# Patient Record
Sex: Female | Born: 1965 | Race: White | Hispanic: No | Marital: Married | Smoking: Never smoker
Health system: Southern US, Community
[De-identification: ages and names within clinical notes are randomized; demographics above are authoritative.]

## PROBLEM LIST (undated history)

## (undated) DIAGNOSIS — L237 Allergic contact dermatitis due to plants, except food: Secondary | ICD-10-CM

## (undated) DIAGNOSIS — S129XXA Fracture of neck, unspecified, initial encounter: Secondary | ICD-10-CM

## (undated) DIAGNOSIS — R51 Headache: Secondary | ICD-10-CM

## (undated) DIAGNOSIS — Z803 Family history of malignant neoplasm of breast: Secondary | ICD-10-CM

## (undated) DIAGNOSIS — R32 Unspecified urinary incontinence: Secondary | ICD-10-CM

## (undated) DIAGNOSIS — E039 Hypothyroidism, unspecified: Secondary | ICD-10-CM

## (undated) DIAGNOSIS — Z800 Family history of malignant neoplasm of digestive organs: Secondary | ICD-10-CM

## (undated) DIAGNOSIS — Z808 Family history of malignant neoplasm of other organs or systems: Secondary | ICD-10-CM

## (undated) DIAGNOSIS — K219 Gastro-esophageal reflux disease without esophagitis: Secondary | ICD-10-CM

## (undated) DIAGNOSIS — M2560 Stiffness of unspecified joint, not elsewhere classified: Secondary | ICD-10-CM

## (undated) DIAGNOSIS — R519 Headache, unspecified: Secondary | ICD-10-CM

## (undated) DIAGNOSIS — J309 Allergic rhinitis, unspecified: Secondary | ICD-10-CM

## (undated) DIAGNOSIS — M51369 Other intervertebral disc degeneration, lumbar region without mention of lumbar back pain or lower extremity pain: Secondary | ICD-10-CM

## (undated) DIAGNOSIS — M5136 Other intervertebral disc degeneration, lumbar region: Secondary | ICD-10-CM

## (undated) DIAGNOSIS — Z8042 Family history of malignant neoplasm of prostate: Secondary | ICD-10-CM

## (undated) HISTORY — DX: Family history of malignant neoplasm of digestive organs: Z80.0

## (undated) HISTORY — DX: Family history of malignant neoplasm of other organs or systems: Z80.8

## (undated) HISTORY — PX: BREAST CYST EXCISION: SHX579

## (undated) HISTORY — PX: WISDOM TOOTH EXTRACTION: SHX21

## (undated) HISTORY — DX: Family history of malignant neoplasm of prostate: Z80.42

## (undated) HISTORY — DX: Family history of malignant neoplasm of breast: Z80.3

---

## 1988-10-28 HISTORY — PX: AUGMENTATION MAMMAPLASTY: SUR837

## 2008-04-15 DIAGNOSIS — D219 Benign neoplasm of connective and other soft tissue, unspecified: Secondary | ICD-10-CM | POA: Insufficient documentation

## 2009-10-28 HISTORY — PX: CERVICAL BIOPSY  W/ LOOP ELECTRODE EXCISION: SUR135

## 2012-12-22 ENCOUNTER — Ambulatory Visit: Payer: Self-pay | Admitting: Family Medicine

## 2012-12-29 ENCOUNTER — Ambulatory Visit: Payer: Self-pay | Admitting: Family Medicine

## 2015-02-28 ENCOUNTER — Other Ambulatory Visit: Payer: Self-pay

## 2015-02-28 DIAGNOSIS — Z1231 Encounter for screening mammogram for malignant neoplasm of breast: Secondary | ICD-10-CM

## 2015-03-06 LAB — LIPID PANEL
CHOLESTEROL: 218 mg/dL — AB (ref 0–200)
HDL: 94 mg/dL — AB (ref 35–70)
LDL Cholesterol: 119 mg/dL
LDL/HDL RATIO: 1.5
TRIGLYCERIDES: 80 mg/dL (ref 40–160)

## 2015-03-06 LAB — HEPATIC FUNCTION PANEL
ALT: 12 U/L (ref 7–35)
AST: 18 U/L (ref 13–35)
Alkaline Phosphatase: 91 U/L (ref 25–125)
Bilirubin, Total: 0.3 mg/dL

## 2015-03-06 LAB — CBC AND DIFFERENTIAL
HEMATOCRIT: 45 % (ref 36–46)
HEMOGLOBIN: 15.5 g/dL (ref 12.0–16.0)
Neutrophils Absolute: 3 /uL
Platelets: 246 10*3/uL (ref 150–399)
WBC: 5.3 10^3/mL

## 2015-03-06 LAB — BASIC METABOLIC PANEL
BUN: 7 mg/dL (ref 4–21)
Creatinine: 0.8 mg/dL (ref 0.5–1.1)
Glucose: 90 mg/dL
Potassium: 4.3 mmol/L (ref 3.4–5.3)
SODIUM: 141 mmol/L (ref 137–147)

## 2015-03-06 LAB — TSH: TSH: 4.67 u[IU]/mL (ref 0.41–5.90)

## 2015-03-06 LAB — HEMOGLOBIN A1C: HEMOGLOBIN A1C: 5.3

## 2015-03-21 ENCOUNTER — Other Ambulatory Visit: Payer: Self-pay | Admitting: Family Medicine

## 2015-03-21 ENCOUNTER — Ambulatory Visit
Admission: RE | Admit: 2015-03-21 | Discharge: 2015-03-21 | Disposition: A | Discharge status: Home / Self Care | Payer: BC Managed Care – PPO | Source: Ambulatory Visit | Attending: Radiology | Admitting: Radiology

## 2015-03-21 DIAGNOSIS — Z9882 Breast implant status: Secondary | ICD-10-CM | POA: Diagnosis not present

## 2015-03-21 DIAGNOSIS — Z1231 Encounter for screening mammogram for malignant neoplasm of breast: Secondary | ICD-10-CM

## 2015-05-05 NOTE — Discharge Instructions (Signed)

## 2015-05-08 ENCOUNTER — Encounter
Admission: RE | Disposition: A | Discharge status: Home / Self Care | Payer: Self-pay | Source: Ambulatory Visit | Attending: Gastroenterology

## 2015-05-08 ENCOUNTER — Ambulatory Visit: Payer: BC Managed Care – PPO | Admitting: Anesthesiology

## 2015-05-08 ENCOUNTER — Ambulatory Visit
Admission: RE | Admit: 2015-05-08 | Discharge: 2015-05-08 | Disposition: A | Discharge status: Home / Self Care | Payer: BC Managed Care – PPO | Source: Ambulatory Visit | Attending: Gastroenterology | Admitting: Gastroenterology

## 2015-05-08 DIAGNOSIS — Z791 Long term (current) use of non-steroidal anti-inflammatories (NSAID): Secondary | ICD-10-CM | POA: Insufficient documentation

## 2015-05-08 DIAGNOSIS — Z793 Long term (current) use of hormonal contraceptives: Secondary | ICD-10-CM | POA: Insufficient documentation

## 2015-05-08 DIAGNOSIS — K640 First degree hemorrhoids: Secondary | ICD-10-CM | POA: Insufficient documentation

## 2015-05-08 DIAGNOSIS — K5730 Diverticulosis of large intestine without perforation or abscess without bleeding: Secondary | ICD-10-CM | POA: Insufficient documentation

## 2015-05-08 DIAGNOSIS — Z79899 Other long term (current) drug therapy: Secondary | ICD-10-CM | POA: Insufficient documentation

## 2015-05-08 DIAGNOSIS — Z1211 Encounter for screening for malignant neoplasm of colon: Secondary | ICD-10-CM | POA: Insufficient documentation

## 2015-05-08 DIAGNOSIS — R51 Headache: Secondary | ICD-10-CM | POA: Diagnosis not present

## 2015-05-08 DIAGNOSIS — M5136 Other intervertebral disc degeneration, lumbar region: Secondary | ICD-10-CM | POA: Insufficient documentation

## 2015-05-08 DIAGNOSIS — E039 Hypothyroidism, unspecified: Secondary | ICD-10-CM | POA: Insufficient documentation

## 2015-05-08 DIAGNOSIS — K219 Gastro-esophageal reflux disease without esophagitis: Secondary | ICD-10-CM | POA: Diagnosis not present

## 2015-05-08 HISTORY — DX: Fracture of neck, unspecified, initial encounter: S12.9XXA

## 2015-05-08 HISTORY — DX: Headache: R51

## 2015-05-08 HISTORY — DX: Headache, unspecified: R51.9

## 2015-05-08 HISTORY — DX: Unspecified urinary incontinence: R32

## 2015-05-08 HISTORY — DX: Allergic rhinitis, unspecified: J30.9

## 2015-05-08 HISTORY — PX: COLONOSCOPY: SHX5424

## 2015-05-08 HISTORY — DX: Other intervertebral disc degeneration, lumbar region: M51.36

## 2015-05-08 HISTORY — DX: Hypothyroidism, unspecified: E03.9

## 2015-05-08 HISTORY — DX: Stiffness of unspecified joint, not elsewhere classified: M25.60

## 2015-05-08 HISTORY — DX: Allergic contact dermatitis due to plants, except food: L23.7

## 2015-05-08 HISTORY — DX: Other intervertebral disc degeneration, lumbar region without mention of lumbar back pain or lower extremity pain: M51.369

## 2015-05-08 HISTORY — DX: Gastro-esophageal reflux disease without esophagitis: K21.9

## 2015-05-08 SURGERY — COLONOSCOPY
Anesthesia: Monitor Anesthesia Care | Wound class: Contaminated

## 2015-05-08 MED ORDER — STERILE WATER FOR IRRIGATION IR SOLN
Status: DC | PRN
  Administered 2015-05-08: 09:00:00

## 2015-05-08 MED ORDER — PROPOFOL 10 MG/ML IV BOLUS
INTRAVENOUS | Status: DC | PRN
  Administered 2015-05-08 (×10): 20 mg via INTRAVENOUS

## 2015-05-08 MED ORDER — LACTATED RINGERS IV SOLN
INTRAVENOUS | Status: DC
  Administered 2015-05-08: 09:00:00 via INTRAVENOUS

## 2015-05-08 MED ORDER — LIDOCAINE HCL (CARDIAC) 20 MG/ML IV SOLN
INTRAVENOUS | Status: DC | PRN
  Administered 2015-05-08: 30 mg via INTRAVENOUS

## 2015-05-08 MED ORDER — LACTATED RINGERS IV SOLN
INTRAVENOUS | Status: DC
  Administered 2015-05-08: 08:00:00 via INTRAVENOUS

## 2015-05-08 MED ORDER — ACETAMINOPHEN 325 MG PO TABS: 325.0000 mg | ORAL_TABLET | ORAL | Status: DC | PRN

## 2015-05-08 MED ORDER — ACETAMINOPHEN 160 MG/5ML PO SOLN: 325.0000 mg | ORAL | Status: DC | PRN

## 2015-05-08 SURGICAL SUPPLY — 28 items

## 2015-05-08 NOTE — Op Note (Signed)
[facility] Patient Name: Erika May Procedure Date: 05/08/2015 8:46 AM MRN: [id number] Account #: [id number] Date of Birth: 03/07/1966 Admit Type: Outpatient Age: 49 Room: [facility] OR ROOM 01 Gender: Female Note Status: Finalized Procedure:         Colonoscopy Indications:       Screening for colorectal malignant neoplasm Providers:         Lucilla Lame, MD Medicines:         Propofol per Anesthesia Complications:     No immediate complications. Procedure:         Pre-Anesthesia Assessment:                    - Prior to the procedure, a History and Physical was                     performed, and patient medications and allergies were                     reviewed. The patient's tolerance of previous anesthesia                     was also reviewed. The risks and benefits of the procedure                     and the sedation options and risks were discussed with the                     patient. All questions were answered, and informed consent                     was obtained. Prior Anticoagulants: The patient has taken                     no previous anticoagulant or antiplatelet agents. ASA                     Grade Assessment: II - A patient with mild systemic                     disease. After reviewing the risks and benefits, the                     patient was deemed in satisfactory condition to undergo                     the procedure.                    After obtaining informed consent, the colonoscope was                     passed under direct vision. Throughout the procedure, the                     patient's blood pressure, pulse, and oxygen saturations                     were monitored continuously. The Olympus CF H180AL                     colonoscope (S#: [id number]) was introduced through the anus                     and advanced to the the cecum, identified  by appendiceal                     orifice and ileocecal valve. The  colonoscopy was performed                     without difficulty. The patient tolerated the procedure                     well. The quality of the bowel preparation was excellent. Findings:      The perianal and digital rectal examinations were normal.      A single small-mouthed diverticulum was found in the sigmoid colon.      Non-bleeding internal hemorrhoids were found during retroflexion. The       hemorrhoids were Grade I (internal hemorrhoids that do not prolapse). Impression:        - Diverticulosis in the sigmoid colon.                    - Non-bleeding internal hemorrhoids.                    - No specimens collected. Recommendation:    - Repeat colonoscopy in 10 years for screening unless any                     change in family history or lower GI problems. Procedure Code(s): --- Professional ---                    [phone], Colonoscopy, flexible; diagnostic, including                     collection of specimen(s) by brushing or washing, when                     performed (separate procedure) Diagnosis Code(s): --- Professional ---                    Z12.11, Encounter for screening for malignant neoplasm of                     colon                    K64.0, First degree hemorrhoids CPT copyright 2014 American [employer] Association. All rights reserved. The codes documented in this report are preliminary and upon coder review may  be revised to meet current compliance requirements. Lucilla Lame, MD 05/08/2015 9:09:12 AM This report has been signed electronically. Number of Addenda: 0 Note Initiated On: 05/08/2015 8:46 AM Scope Withdrawal Time: 0 hours 7 minutes 56 seconds  Total Procedure Duration: 0 hours 13 minutes 48 seconds       [facility]

## 2015-05-08 NOTE — Transfer of Care (Signed)
Immediate Anesthesia Transfer of Care Note  Patient: Erika May  Procedure(s) Performed: Procedure(s): COLONOSCOPY (N/A)  Patient Location: PACU  Anesthesia Type: MAC  Level of Consciousness: awake, alert  and patient cooperative  Airway and Oxygen Therapy: Patient Spontanous Breathing and Patient connected to supplemental oxygen  Post-op Assessment: Post-op Vital signs reviewed, Patient's Cardiovascular Status Stable, Respiratory Function Stable, Patent Airway and No signs of Nausea or vomiting  Post-op Vital Signs: Reviewed and stable  Complications: No apparent anesthesia complications

## 2015-05-08 NOTE — H&P (Signed)
[facility]  [address] [place], [place] [address] Phone: [phone] Fax : [phone]  Primary Care Physician:  No primary care provider on file. Primary Gastroenterologist:  Dr. Allen Norris  Pre-Procedure History & Physical: HPI:  Erika May Hy is a 49 y.o. female is here for a screening colonoscopy.   Past Medical History  Diagnosis Date  . 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 Hypothyroidism   . Rhinitis, allergic   . DDD (degenerative disc disease), lumbar   . Incontinence   . Joint stiffness   . GERD (gastroesophageal reflux disease)     heartburn occasional  . 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 Headache     1/week    Past Surgical History  Procedure Laterality Date  . Cervical biopsy  w/ loop electrode excision  10/2009  . Wisdom tooth extraction    . Augmentation mammaplasty Bilateral 1990  . Breast cyst excision Bilateral 1990's    benign    Prior to Admission medications   Medication Sig Start Date End Date Taking? Authorizing Provider  Calcium Carb-Cholecalciferol (CALCIUM + D3) 600-200 MG-UNIT TABS Take by mouth 3 (three) times a week.    Yes Historical Provider, MD  ibuprofen (ADVIL,MOTRIN) 200 MG tablet Take 200 mg by mouth every 6 (six) hours as needed for headache.    Yes Historical Provider, MD  levothyroxine (SYNTHROID, LEVOTHROID) 25 MCG tablet Take 50 mcg by mouth daily before breakfast.    Yes Historical Provider, MD  loratadine (CLARITIN) 10 MG tablet Take 10 mg by mouth daily. am   Yes Historical Provider, MD  Multiple Vitamin (MULTIVITAMIN) capsule Take 1 capsule by mouth daily. am   Yes Historical Provider, MD  Norethindrone Acetate-Ethinyl Estradiol (LOESTRIN 1.5/30, 21,) 1.5-30 MG-MCG tablet Take 1 tablet by mouth daily.   Yes Historical Provider, MD  Sennosides-Docusate Sodium (PERI-COLACE PO) Take by mouth as needed.   Yes Historical Provider, MD  calcium carbonate (TUMS - DOSED IN MG  ELEMENTAL CALCIUM) 500 MG chewable tablet Chew 1 tablet by mouth as needed for indigestion or heartburn.    Historical Provider, MD    Allergies as of 04/10/2015  . (Not on File)    History reviewed. No pertinent family history.  History   Social History  . Marital Status: Married    Spouse Name: N/A  . Number of Children: N/A  . Years of Education: N/A   Occupational History  . Not on file.   Social History Main Topics  . Smoking status: Never Smoker   . Smokeless tobacco: Not on file  . Alcohol Use: Yes     Comment: occasional/1 drink 2x week  . Drug Use: Not on file  . Sexual Activity: Yes    Birth Control/ Protection: Pill   Other Topics Concern  . Not on file   Social History Narrative    Review of Systems: See HPI, otherwise negative ROS  Physical Exam: BP 138/94 mmHg  Pulse 60  Temp(Src) 97.7 F (36.5 C)  Resp 16  Ht 5\' 4"  (1.626 m)  Wt 150 lb (68.04 kg)  BMI 25.73 kg/m2  SpO2 100%  LMP 02/28/2015 (Approximate) General:   Alert,  pleasant and cooperative in NAD Head:  Normocephalic and atraumatic. Neck:  Supple; no masses or thyromegaly. Lungs:  Clear throughout to auscultation.    Heart:  Regular rate and rhythm. Abdomen:  Soft, nontender and nondistended. Normal bowel sounds, without  guarding, and without rebound.   Neurologic:  Alert and  oriented x4;  grossly normal neurologically.  Impression/Plan: Erika May is now here to undergo a screening colonoscopy.  Risks, benefits, and alternatives regarding colonoscopy have been reviewed with the patient.  Questions have been answered.  All parties agreeable.

## 2015-05-08 NOTE — Anesthesia Postprocedure Evaluation (Signed)
  Anesthesia Post-op Note  Patient: Erika May  Procedure(s) Performed: Procedure(s): COLONOSCOPY (N/A)  Anesthesia type:MAC  Patient location: PACU  Post pain: Pain level controlled  Post assessment: Post-op Vital signs reviewed, Patient's Cardiovascular Status Stable, Respiratory Function Stable, Patent Airway and No signs of Nausea or vomiting  Post vital signs: Reviewed and stable  Last Vitals:  Filed Vitals:   05/08/15 0915  BP: 97/49  Pulse: 69  Temp:   Resp: 17    Level of consciousness: awake, alert  and patient cooperative  Complications: No apparent anesthesia complications

## 2015-05-08 NOTE — Anesthesia Preprocedure Evaluation (Signed)
Anesthesia Evaluation  Patient identified by MRN, date of birth, ID band  Reviewed: Allergy & Precautions, H&P , NPO status , Patient's Chart, lab work & pertinent test results  Airway Mallampati: I  TM Distance: >3 FB Neck ROM: full    Dental no notable dental hx.    Pulmonary    Pulmonary exam normal       Cardiovascular Rhythm:regular Rate:Normal     Neuro/Psych    GI/Hepatic GERD-  ,  Endo/Other  Hypothyroidism   Renal/GU      Musculoskeletal   Abdominal   Peds  Hematology   Anesthesia Other Findings   Reproductive/Obstetrics                             Anesthesia Physical Anesthesia Plan  ASA: II  Anesthesia Plan: MAC   Post-op Pain Management:    Induction:   Airway Management Planned:   Additional Equipment:   Intra-op Plan:   Post-operative Plan:   Informed Consent: I have reviewed the patients History and Physical, chart, labs and discussed the procedure including the risks, benefits and alternatives for the proposed anesthesia with the patient or authorized representative who has indicated his/her understanding and acceptance.     Plan Discussed with: CRNA  Anesthesia Plan Comments:         Anesthesia Quick Evaluation

## 2015-05-09 ENCOUNTER — Encounter: Payer: Self-pay | Admitting: Gastroenterology

## 2015-10-07 ENCOUNTER — Other Ambulatory Visit: Payer: Self-pay | Admitting: Family Medicine

## 2015-10-07 DIAGNOSIS — E039 Hypothyroidism, unspecified: Secondary | ICD-10-CM

## 2015-10-09 DIAGNOSIS — E039 Hypothyroidism, unspecified: Secondary | ICD-10-CM | POA: Insufficient documentation

## 2015-10-09 NOTE — Telephone Encounter (Signed)
Last OV 02/2015  Thanks,   -Advay Volante  

## 2015-10-10 ENCOUNTER — Telehealth: Payer: Self-pay | Admitting: Family Medicine

## 2016-01-30 ENCOUNTER — Telehealth: Payer: Self-pay | Admitting: Family Medicine

## 2016-03-13 DIAGNOSIS — IMO0001 Reserved for inherently not codable concepts without codable children: Secondary | ICD-10-CM | POA: Insufficient documentation

## 2016-03-13 DIAGNOSIS — N951 Menopausal and female climacteric states: Secondary | ICD-10-CM | POA: Insufficient documentation

## 2016-03-13 DIAGNOSIS — Z3009 Encounter for other general counseling and advice on contraception: Secondary | ICD-10-CM | POA: Insufficient documentation

## 2016-03-13 DIAGNOSIS — E559 Vitamin D deficiency, unspecified: Secondary | ICD-10-CM | POA: Insufficient documentation

## 2016-03-13 DIAGNOSIS — L255 Unspecified contact dermatitis due to plants, except food: Secondary | ICD-10-CM | POA: Insufficient documentation

## 2016-03-13 DIAGNOSIS — R030 Elevated blood-pressure reading, without diagnosis of hypertension: Secondary | ICD-10-CM

## 2016-03-13 DIAGNOSIS — R7303 Prediabetes: Secondary | ICD-10-CM | POA: Insufficient documentation

## 2016-03-13 DIAGNOSIS — D239 Other benign neoplasm of skin, unspecified: Secondary | ICD-10-CM | POA: Insufficient documentation

## 2016-03-13 DIAGNOSIS — E785 Hyperlipidemia, unspecified: Secondary | ICD-10-CM | POA: Insufficient documentation

## 2016-04-02 ENCOUNTER — Encounter: Payer: Self-pay | Admitting: Family Medicine

## 2016-04-02 ENCOUNTER — Ambulatory Visit (INDEPENDENT_AMBULATORY_CARE_PROVIDER_SITE_OTHER): Payer: BC Managed Care – PPO | Admitting: Family Medicine

## 2016-04-02 VITALS — BP 130/78 | HR 76 | Temp 97.9°F | Resp 16 | Ht 64.5 in | Wt 149.0 lb

## 2016-04-02 DIAGNOSIS — Z0000 Encounter for general adult medical examination without abnormal findings: Secondary | ICD-10-CM

## 2016-04-02 DIAGNOSIS — R030 Elevated blood-pressure reading, without diagnosis of hypertension: Secondary | ICD-10-CM | POA: Diagnosis not present

## 2016-04-02 DIAGNOSIS — Z23 Encounter for immunization: Secondary | ICD-10-CM | POA: Diagnosis not present

## 2016-04-02 DIAGNOSIS — Z1239 Encounter for other screening for malignant neoplasm of breast: Secondary | ICD-10-CM | POA: Diagnosis not present

## 2016-04-02 DIAGNOSIS — R7303 Prediabetes: Secondary | ICD-10-CM | POA: Diagnosis not present

## 2016-04-02 DIAGNOSIS — E785 Hyperlipidemia, unspecified: Secondary | ICD-10-CM

## 2016-04-02 DIAGNOSIS — IMO0001 Reserved for inherently not codable concepts without codable children: Secondary | ICD-10-CM

## 2016-04-02 DIAGNOSIS — E559 Vitamin D deficiency, unspecified: Secondary | ICD-10-CM | POA: Diagnosis not present

## 2016-04-02 DIAGNOSIS — E039 Hypothyroidism, unspecified: Secondary | ICD-10-CM | POA: Diagnosis not present

## 2016-04-02 LAB — POCT URINALYSIS DIPSTICK
Bilirubin, UA: NEGATIVE
Blood, UA: NEGATIVE
Glucose, UA: NEGATIVE
Ketones, UA: NEGATIVE
Leukocytes, UA: NEGATIVE
Nitrite, UA: NEGATIVE
PH UA: 7
PROTEIN UA: NEGATIVE
UROBILINOGEN UA: 0.2

## 2016-04-02 MED ORDER — MICROGESTIN FE 1.5/30 1.5-30 MG-MCG PO TABS: 1.0000 | ORAL_TABLET | Freq: Every day | ORAL | Status: DC

## 2016-04-02 MED ORDER — LEVOTHYROXINE SODIUM 50 MCG PO TABS: 50.0000 ug | ORAL_TABLET | Freq: Every day | ORAL | Status: DC

## 2016-04-02 NOTE — Patient Instructions (Addendum)
Please call the [facility] at [facility] to schedule this at [phone]   

## 2016-04-02 NOTE — Progress Notes (Signed)
Patient ID: Erika May, female   DOB: 01-13-1966, 50 y.o.   MRN: [id number]       Patient: Erika May, Female    DOB: [birth year], 50 y.o.   MRN: [id number] Visit Date: 04/02/2016  Today's Provider: Margarita Rana, MD   Chief Complaint  Patient presents with  . Annual Exam   Subjective:    Annual physical exam Erika May is a 50 y.o. female who presents today for health maintenance and complete physical. She feels well. She reports exercising daily. She reports she is sleeping well.  02/28/15 CPE 01/19/14 Pap-neg; HPV-neg 03/21/15 Mammogram-BI-RADS 1 05/08/15 Colonoscopy-diverticulosis, recheck in 10 yrs. Dr. Allen Norris  -----------------------------------------------------------------   Review of Systems  Constitutional: Positive for diaphoresis and fatigue.  HENT: Positive for postnasal drip.   Eyes: Negative.   Respiratory: Negative.   Cardiovascular: Negative.   Gastrointestinal: Negative.   Endocrine: Negative.   Genitourinary: Positive for urgency.  Musculoskeletal: Negative.   Skin: Negative.   Allergic/Immunologic: Positive for environmental allergies.  Neurological: Negative.   Hematological: Negative.   Psychiatric/Behavioral: Negative.     Social History      She  reports that she has never smoked. She has never used smokeless tobacco. She reports that she drinks alcohol. She reports that she does not use illicit drugs.       Social History   Social History  . Marital Status: Married    Spouse Name: N/A  . Number of Children: N/A  . Years of Education: N/A   Social History Main Topics  . Smoking status: Never Smoker   . Smokeless tobacco: Never Used  . Alcohol Use: Yes     Comment: occasional/1 drink 2x week  . Drug Use: No  . Sexual Activity: Yes    Birth Control/ Protection: Pill   Other Topics Concern  . None   Social History Narrative    Past Medical History  Diagnosis Date  . Allergic dermatitis due to  poison ivy   . Hypothyroidism   . Rhinitis, allergic   . DDD (degenerative disc disease), lumbar   . Incontinence   . Joint stiffness   . GERD (gastroesophageal reflux disease)     heartburn occasional  . 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 Headache     1/week     Patient Active Problem List   Diagnosis Date Noted  . HLD (hyperlipidemia) 03/13/2016  . Family planning 03/13/2016  . Benign neoplasm of skin 03/13/2016  . Blood pressure elevated 03/13/2016  . Symptomatic menopausal or female climacteric states 03/13/2016  . Borderline diabetes 03/13/2016  . Contact dermatitis due to Genus Toxicodendron 03/13/2016  . Avitaminosis D 03/13/2016  . Adult hypothyroidism 10/09/2015  . Special screening for malignant neoplasms, colon   . First degree hemorrhoids   . Fibroid 04/15/2008  . Allergic rhinitis 08/10/2007  . Narrowing of intervertebral disc space 08/10/2007    Past Surgical History  Procedure Laterality Date  . Cervical biopsy  w/ loop electrode excision  10/2009  . Wisdom tooth extraction    . Augmentation mammaplasty Bilateral 1990  . Breast cyst excision Bilateral 1990's    benign  . Colonoscopy N/A 05/08/2015    Procedure: COLONOSCOPY;  Surgeon: Lucilla Lame, MD;  Location: [facility];  Service: Gastroenterology;  Laterality: N/A;    Family History        Family Status  Relation Status Death Age  . Mother Alive   .  Father Deceased 27  . Sister Alive   . Brother Alive         Her family history includes Arthritis in her mother; Cataracts in her father; Emphysema in her father; Hyperlipidemia in her father; Hypothyroidism in her mother; Melanoma in her father.    No Known Allergies  Previous Medications   CALCIUM CARB-CHOLECALCIFEROL (CALCIUM + D3) 600-200 MG-UNIT TABS    Take by mouth 3 (three) times a week.    CALCIUM CARBONATE (TUMS - DOSED IN MG ELEMENTAL CALCIUM) 500 MG CHEWABLE TABLET    Chew 1 tablet by mouth as  needed for indigestion or heartburn.   IBUPROFEN (ADVIL,MOTRIN) 200 MG TABLET    Take 200 mg by mouth every 6 (six) hours as needed for headache.    LORATADINE (CLARITIN) 10 MG TABLET    Take 10 mg by mouth daily. am   MICROGESTIN FE 1.5/30 1.5-30 MG-MCG TABLET    Take 1 tablet by mouth daily.   MULTIPLE VITAMIN (MULTIVITAMIN) CAPSULE    Take 1 capsule by mouth daily. am   SENNOSIDES-DOCUSATE SODIUM (PERI-COLACE PO)    Take by mouth as needed.    Patient Care Team: Margarita Rana, MD as PCP - General (Family Medicine)     Objective:   Vitals: BP 130/78 mmHg  Pulse 76  Temp(Src) 97.9 F (36.6 C) (Oral)  Resp 16  Ht 5' 4.5" (1.638 m)  Wt 149 lb (67.586 kg)  BMI 25.19 kg/m2  LMP 03/24/2016 (Exact Date)   Physical Exam  Constitutional: She is oriented to person, place, and time. She appears well-developed and well-nourished.  HENT:  Head: Normocephalic and atraumatic.  Right Ear: Tympanic membrane, external ear and ear canal normal.  Left Ear: Tympanic membrane, external ear and ear canal normal.  Nose: Nose normal.  Mouth/Throat: Uvula is midline, oropharynx is clear and moist and mucous membranes are normal.  Eyes: Conjunctivae, EOM and lids are normal. Pupils are equal, round, and reactive to light.  Neck: Trachea normal and normal range of motion. Neck supple. Carotid bruit is not present. No thyroid mass and no thyromegaly present.  Cardiovascular: Normal rate, regular rhythm and normal heart sounds.   Pulmonary/Chest: Effort normal and breath sounds normal.  Bilateral breast implants  Abdominal: Soft. Normal appearance and bowel sounds are normal. There is no hepatosplenomegaly. There is no tenderness.  Genitourinary: No breast swelling, tenderness or discharge.  Musculoskeletal: Normal range of motion.  Lymphadenopathy:    She has no cervical adenopathy.    She has no axillary adenopathy.  Neurological: She is alert and oriented to person, place, and time. She has normal  strength. No cranial nerve deficit.  Skin: Skin is warm, dry and intact.  Psychiatric: She has a normal mood and affect. Her speech is normal and behavior is normal. Judgment and thought content normal. Cognition and memory are normal.    Depression Screen PHQ 2/9 Scores 04/02/2016  PHQ - 2 Score 0    Assessment & Plan:     Routine Health Maintenance and Physical Exam  Exercise Activities and Dietary recommendations Goals    None      Immunization History  Administered Date(s) Administered  . Tdap 04/02/2016      1. Annual physical exam Stable. Patient advised to continue eating healthy and exercise daily. - POCT urinalysis dipstick  2. Hypothyroidism, unspecified hypothyroidism type - TSH  3. Borderline diabetes - Hemoglobin A1c  4. Avitaminosis D - VITAMIN D 25 Hydroxy (Vit-D Deficiency, Fractures)  5. HLD (hyperlipidemia) - Lipid Panel With LDL/HDL Ratio  6. Blood pressure elevated - CBC with Differential/Platelet - Comprehensive metabolic panel  7. Need for Tdap vaccination - Tdap vaccine greater than or equal to 7yo IM  8. Breast cancer screening - MM DIGITAL SCREENING BILATERAL; Future   Patient seen and examined by Dr. Jerrell Belfast, and note scribed by Philbert Riser. Dimas, CMA.  I have reviewed the document for accuracy and completeness and I agree with above. Jerrell Belfast, MD   Margarita Rana, MD    --------------------------------------------------------------------

## 2016-04-16 ENCOUNTER — Telehealth: Payer: Self-pay

## 2016-04-16 LAB — COMPREHENSIVE METABOLIC PANEL
ALBUMIN: 4.4 g/dL (ref 3.5–5.5)
ALT: 8 IU/L (ref 0–32)
AST: 16 IU/L (ref 0–40)
Albumin/Globulin Ratio: 2.2 (ref 1.2–2.2)
Alkaline Phosphatase: 78 IU/L (ref 39–117)
BILIRUBIN TOTAL: 0.3 mg/dL (ref 0.0–1.2)
BUN / CREAT RATIO: 11 (ref 9–23)
BUN: 9 mg/dL (ref 6–24)
CHLORIDE: 101 mmol/L (ref 96–106)
CO2: 22 mmol/L (ref 18–29)
Calcium: 9.5 mg/dL (ref 8.7–10.2)
Creatinine, Ser: 0.82 mg/dL (ref 0.57–1.00)
GFR calc non Af Amer: 83 mL/min/{1.73_m2} (ref >59)
GFR, EST AFRICAN AMERICAN: 96 mL/min/{1.73_m2} (ref >59)
Globulin, Total: 2 g/dL (ref 1.5–4.5)
Glucose: 91 mg/dL (ref 65–99)
POTASSIUM: 4.5 mmol/L (ref 3.5–5.2)
Sodium: 140 mmol/L (ref 134–144)
TOTAL PROTEIN: 6.4 g/dL (ref 6.0–8.5)

## 2016-04-16 LAB — LIPID PANEL WITH LDL/HDL RATIO
Cholesterol, Total: 177 mg/dL (ref 100–199)
HDL: 72 mg/dL (ref >39)
LDL Calculated: 92 mg/dL (ref 0–99)
LDl/HDL Ratio: 1.3 ratio units (ref 0.0–3.2)
Triglycerides: 67 mg/dL (ref 0–149)
VLDL CHOLESTEROL CAL: 13 mg/dL (ref 5–40)

## 2016-04-16 LAB — CBC WITH DIFFERENTIAL/PLATELET
BASOS: 1 %
Basophils Absolute: 0 10*3/uL (ref 0.0–0.2)
EOS (ABSOLUTE): 0.2 10*3/uL (ref 0.0–0.4)
Eos: 3 %
Hematocrit: 40.9 % (ref 34.0–46.6)
Hemoglobin: 14 g/dL (ref 11.1–15.9)
IMMATURE GRANS (ABS): 0 10*3/uL (ref 0.0–0.1)
Immature Granulocytes: 0 %
LYMPHS ABS: 1.9 10*3/uL (ref 0.7–3.1)
LYMPHS: 34 %
MCH: 31.6 pg (ref 26.6–33.0)
MCHC: 34.2 g/dL (ref 31.5–35.7)
MCV: 92 fL (ref 79–97)
Monocytes Absolute: 0.4 10*3/uL (ref 0.1–0.9)
Monocytes: 6 %
Neutrophils Absolute: 3.1 10*3/uL (ref 1.4–7.0)
Neutrophils: 56 %
PLATELETS: 240 10*3/uL (ref 150–379)
RBC: 4.43 x10E6/uL (ref 3.77–5.28)
RDW: 12.6 % (ref 12.3–15.4)
WBC: 5.6 10*3/uL (ref 3.4–10.8)

## 2016-04-16 LAB — HEMOGLOBIN A1C
Est. average glucose Bld gHb Est-mCnc: 103 mg/dL
Hgb A1c MFr Bld: 5.2 % (ref 4.8–5.6)

## 2016-04-16 LAB — TSH: TSH: 2.8 u[IU]/mL (ref 0.450–4.500)

## 2016-04-16 LAB — VITAMIN D 25 HYDROXY (VIT D DEFICIENCY, FRACTURES): Vit D, 25-Hydroxy: 44.2 ng/mL (ref 30.0–100.0)

## 2016-04-16 NOTE — Telephone Encounter (Signed)
Pt advised.   Thanks,   -Brindley Madarang  

## 2016-04-16 NOTE — Telephone Encounter (Signed)
-----   Message from Margarita Rana, MD sent at 04/16/2016  6:53 AM EDT ----- Labs normal. Please notify patient. Thanks.

## 2016-06-04 NOTE — Telephone Encounter (Signed)
error 

## 2016-06-27 ENCOUNTER — Telehealth: Payer: Self-pay | Admitting: Physician Assistant

## 2016-06-27 DIAGNOSIS — Z1239 Encounter for other screening for malignant neoplasm of breast: Secondary | ICD-10-CM

## 2016-06-27 NOTE — Telephone Encounter (Signed)
Pt states she had her CPE June 2017 with Dr Venia Minks and is trying to schedule a mammogram.  [name] is requesting an order sent.  CB#[phone]/MW

## 2016-06-28 NOTE — Telephone Encounter (Signed)
Mammogram ordered

## 2016-06-28 NOTE — Telephone Encounter (Signed)
Patient advised.  Thanks,  -Joseline 

## 2016-07-29 ENCOUNTER — Ambulatory Visit
Admission: RE | Admit: 2016-07-29 | Discharge: 2016-07-29 | Disposition: A | Discharge status: Home / Self Care | Payer: BC Managed Care – PPO | Source: Ambulatory Visit | Attending: Physician Assistant | Admitting: Physician Assistant

## 2016-07-29 ENCOUNTER — Other Ambulatory Visit: Payer: Self-pay | Admitting: Physician Assistant

## 2016-07-29 DIAGNOSIS — Z9882 Breast implant status: Secondary | ICD-10-CM | POA: Diagnosis not present

## 2016-07-29 DIAGNOSIS — Z1231 Encounter for screening mammogram for malignant neoplasm of breast: Secondary | ICD-10-CM | POA: Insufficient documentation

## 2016-07-29 DIAGNOSIS — Z1239 Encounter for other screening for malignant neoplasm of breast: Secondary | ICD-10-CM

## 2017-04-03 ENCOUNTER — Ambulatory Visit (INDEPENDENT_AMBULATORY_CARE_PROVIDER_SITE_OTHER): Payer: BC Managed Care – PPO | Admitting: Physician Assistant

## 2017-04-03 ENCOUNTER — Encounter: Payer: Self-pay | Admitting: Physician Assistant

## 2017-04-03 VITALS — BP 138/80 | HR 88 | Temp 98.2°F | Resp 16 | Ht 64.5 in | Wt 163.0 lb

## 2017-04-03 DIAGNOSIS — Z1239 Encounter for other screening for malignant neoplasm of breast: Secondary | ICD-10-CM

## 2017-04-03 DIAGNOSIS — Z6827 Body mass index (BMI) 27.0-27.9, adult: Secondary | ICD-10-CM

## 2017-04-03 DIAGNOSIS — N951 Menopausal and female climacteric states: Secondary | ICD-10-CM | POA: Diagnosis not present

## 2017-04-03 DIAGNOSIS — Z1231 Encounter for screening mammogram for malignant neoplasm of breast: Secondary | ICD-10-CM

## 2017-04-03 DIAGNOSIS — R7303 Prediabetes: Secondary | ICD-10-CM | POA: Diagnosis not present

## 2017-04-03 DIAGNOSIS — Z6826 Body mass index (BMI) 26.0-26.9, adult: Secondary | ICD-10-CM | POA: Insufficient documentation

## 2017-04-03 DIAGNOSIS — E039 Hypothyroidism, unspecified: Secondary | ICD-10-CM

## 2017-04-03 DIAGNOSIS — E559 Vitamin D deficiency, unspecified: Secondary | ICD-10-CM | POA: Diagnosis not present

## 2017-04-03 DIAGNOSIS — Z0000 Encounter for general adult medical examination without abnormal findings: Secondary | ICD-10-CM

## 2017-04-03 DIAGNOSIS — E7800 Pure hypercholesterolemia, unspecified: Secondary | ICD-10-CM

## 2017-04-03 MED ORDER — MICROGESTIN FE 1.5/30 1.5-30 MG-MCG PO TABS: 1.0000 | ORAL_TABLET | Freq: Every day | ORAL | 3 refills | Status: DC

## 2017-04-03 MED ORDER — LEVOTHYROXINE SODIUM 50 MCG PO TABS: 50.0000 ug | ORAL_TABLET | Freq: Every day | ORAL | 3 refills | Status: DC

## 2017-04-03 MED ORDER — PHENTERMINE HCL 37.5 MG PO TABS: 37.5000 mg | ORAL_TABLET | Freq: Every day | ORAL | 0 refills | Status: DC

## 2017-04-03 NOTE — Progress Notes (Signed)
Patient: Erika May, Female    DOB: 1966/01/10, 51 y.o.   MRN: [id number] Visit Date: 04/03/2017  Today's Provider: Mar Daring, PA-C   Chief Complaint  Patient presents with  . Annual Exam   Subjective:    Annual physical exam Erika May is a 51 y.o. female who presents today for health maintenance and complete physical. She feels fairly well. She is c/o fatigue. She attributes this to weight gain. She reports exercising 3-5 times a week; walks for 50 minutes. Shereports she is sleeping well.  Wt Readings from Last 3 Encounters:  04/03/17 163 lb (73.9 kg)  04/02/16 149 lb (67.6 kg)  02/28/15 154 lb (69.9 kg)   Pt is perimenopausal. Her menses are irregular, and are becoming lighter. She is experiencing weight gain, hot flashes (while not taking OCPs). She needs a refill on her OCPs.  Last CPE- 04/02/2016 Last pap- 01/19/2014- Negative. HPV negative. Last mammogram- 07/29/2016- BI-RADS 1 Last colonoscopy- 05/08/2015. Diverticulosis. Recheck 10 years. -----------------------------------------------------------------   Review of Systems  Constitutional: Positive for activity change, appetite change, diaphoresis and unexpected weight change. Negative for chills, fatigue and fever.  HENT: Positive for congestion. Negative for dental problem, drooling, ear discharge, ear pain, facial swelling, hearing loss, mouth sores, nosebleeds, postnasal drip, rhinorrhea, sinus pain, sinus pressure, sneezing, sore throat, tinnitus, trouble swallowing and voice change.   Eyes: Negative.   Respiratory: Positive for cough. Negative for apnea, choking, chest tightness, shortness of breath, wheezing and stridor.   Cardiovascular: Negative.   Gastrointestinal: Negative.   Endocrine: Positive for cold intolerance and heat intolerance. Negative for polydipsia, polyphagia and polyuria.  Genitourinary: Positive for enuresis. Negative for decreased urine volume,  difficulty urinating, dyspareunia, dysuria, flank pain, frequency, genital sores, hematuria, menstrual problem, pelvic pain, urgency, vaginal bleeding, vaginal discharge and vaginal pain.  Musculoskeletal: Positive for arthralgias, back pain, neck pain and neck stiffness. Negative for gait problem, joint swelling and myalgias.  Skin: Positive for rash (occ poison ivey). Negative for color change, pallor and wound.  Allergic/Immunologic: Positive for environmental allergies. Negative for food allergies and immunocompromised state.  Neurological: Negative.   Hematological: Negative.   Psychiatric/Behavioral: Negative.     Social History      She  reports that she has never smoked. She has never used smokeless tobacco. She reports that she drinks alcohol. She reports that she does not use drugs.       Social History   Social History  . Marital status: Married    Spouse name: N/A  . Number of children: N/A  . Years of education: N/A   Social History Main Topics  . Smoking status: Never Smoker  . Smokeless tobacco: Never Used  . Alcohol use Yes     Comment: occasional/1 drink 2x week  . Drug use: No  . Sexual activity: Yes    Birth control/ protection: Pill   Other Topics Concern  . None   Social History Narrative  . None    Past Medical History:  Diagnosis Date  . 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 DDD (degenerative disc disease), lumbar   . GERD (gastroesophageal reflux disease)    heartburn occasional  . Headache    1/week  . Hypothyroidism   . Incontinence   . Joint stiffness   . Rhinitis, allergic      Patient Active Problem  List   Diagnosis Date Noted  . HLD (hyperlipidemia) 03/13/2016  . Family planning 03/13/2016  . Benign neoplasm of skin 03/13/2016  . Blood pressure elevated 03/13/2016  . Symptomatic menopausal or female climacteric states 03/13/2016  . Borderline diabetes  03/13/2016  . Contact dermatitis due to Genus Toxicodendron 03/13/2016  . Avitaminosis D 03/13/2016  . Adult hypothyroidism 10/09/2015  . Special screening for malignant neoplasms, colon   . First degree hemorrhoids   . Fibroid 04/15/2008  . Allergic rhinitis 08/10/2007  . Narrowing of intervertebral disc space 08/10/2007    Past Surgical History:  Procedure Laterality Date  . AUGMENTATION MAMMAPLASTY Bilateral 4765   silicone  . BREAST CYST EXCISION Bilateral 1990's   benign  . CERVICAL BIOPSY  W/ LOOP ELECTRODE EXCISION  10/2009  . COLONOSCOPY N/A 05/08/2015   Procedure: COLONOSCOPY;  Surgeon: Lucilla Lame, MD;  Location: [place];  Service: Gastroenterology;  Laterality: N/A;  . WISDOM TOOTH EXTRACTION      Family History        Family Status  Relation Status  . Mother Alive  . Father Deceased at age 38  . Sister Alive  . Mat Aunt (Not Specified)        Her family history includes Arthritis in her mother; Breast cancer in her maternal aunt; Cataracts in her father; Emphysema in her father; Healthy in her sister; Hyperlipidemia in her father and mother; Hypertension in her mother; Hypothyroidism in her mother; Melanoma in her father; Transient ischemic attack in her mother.     No Known Allergies   Current Outpatient Prescriptions:  .  Calcium Carb-Cholecalciferol (CALCIUM + D3) 600-200 MG-UNIT TABS, Take by mouth 3 (three) times a week. , Disp: , Rfl:  .  calcium carbonate (TUMS - DOSED IN MG ELEMENTAL CALCIUM) 500 MG chewable tablet, Chew 1 tablet by mouth as needed for indigestion or heartburn., Disp: , Rfl:  .  ibuprofen (ADVIL,MOTRIN) 200 MG tablet, Take 200 mg by mouth every 6 (six) hours as needed for headache. , Disp: , Rfl:  .  levothyroxine (LEVOTHROID) 50 MCG tablet, Take 1 tablet (50 mcg total) by mouth daily before breakfast., Disp: 90 tablet, Rfl: 3 .  loratadine (CLARITIN) 10 MG tablet, Take 10 mg by mouth daily. am, Disp: , Rfl:  .  MICROGESTIN FE  1.5/30 1.5-30 MG-MCG tablet, Take 1 tablet by mouth daily., Disp: 3 Package, Rfl: 3 .  Multiple Vitamin (MULTIVITAMIN) capsule, Take 1 capsule by mouth daily. am, Disp: , Rfl:  .  Sennosides-Docusate Sodium (PERI-COLACE PO), Take by mouth as needed., Disp: , Rfl:    Patient Care Team: Mar Daring, PA-C as PCP - General (Family Medicine)      Objective:   Vitals: BP 138/80 (BP Location: Right Arm, Patient Position: Sitting, Cuff Size: Normal)   Pulse 88   Temp 98.2 F (36.8 C) (Oral)   Resp 16   Ht 5' 4.5" (1.638 m)   Wt 163 lb (73.9 kg)   LMP 03/24/2017   BMI 27.55 kg/m    Vitals:   04/03/17 1408  BP: 138/80  Pulse: 88  Resp: 16  Temp: 98.2 F (36.8 C)  TempSrc: Oral  Weight: 163 lb (73.9 kg)  Height: 5' 4.5" (1.638 m)     Physical Exam  Constitutional: She is oriented to person, place, and time. She appears well-developed and well-nourished. No distress.  HENT:  Head: Normocephalic and atraumatic.  Right Ear: Hearing, tympanic membrane, external ear and ear canal  normal.  Left Ear: Hearing, tympanic membrane, external ear and ear canal normal.  Nose: Nose normal.  Mouth/Throat: Uvula is midline, oropharynx is clear and moist and mucous membranes are normal. No oropharyngeal exudate.  Eyes: Conjunctivae and EOM are normal. Pupils are equal, round, and reactive to light. Right eye exhibits no discharge. Left eye exhibits no discharge. No scleral icterus.  Neck: Normal range of motion. Neck supple. No JVD present. Carotid bruit is not present. No tracheal deviation present. No thyromegaly present.  Cardiovascular: Normal rate, regular rhythm, normal heart sounds and intact distal pulses.  Exam reveals no gallop and no friction rub.   No murmur heard. Pulmonary/Chest: Effort normal and breath sounds normal. No respiratory distress. She has no wheezes. She has no rales. She exhibits no tenderness. Right breast exhibits no inverted nipple, no mass, no nipple  discharge, no skin change and no tenderness. Left breast exhibits no inverted nipple, no mass, no nipple discharge, no skin change and no tenderness. Breasts are symmetrical.  Abdominal: Soft. Bowel sounds are normal. She exhibits no distension and no mass. There is no tenderness. There is no rebound and no guarding.  Musculoskeletal: Normal range of motion. She exhibits no edema or tenderness.  Lymphadenopathy:    She has no cervical adenopathy.  Neurological: She is alert and oriented to person, place, and time.  Skin: Skin is warm and dry. No rash noted. She is not diaphoretic.  Psychiatric: She has a normal mood and affect. Her behavior is normal. Judgment and thought content normal.  Vitals reviewed.    Depression Screen PHQ 2/9 Scores 04/03/2017 04/02/2016  PHQ - 2 Score 0 0  PHQ- 9 Score 4 -      Assessment & Plan:     Routine Health Maintenance and Physical Exam  Exercise Activities and Dietary recommendations Goals    None      Immunization History  Administered Date(s) Administered  . Tdap 04/02/2016    Health Maintenance  Topic Date Due  . HIV Screening  10/09/1979  . PAP SMEAR  01/19/2017  . INFLUENZA VACCINE  05/28/2017  . MAMMOGRAM  07/29/2018  . COLONOSCOPY  05/07/2025  . TETANUS/TDAP  04/02/2026  . Hepatitis C Screening  Completed     Discussed health benefits of physical activity, and encouraged her to engage in regular exercise appropriate for her age and condition.   1. Annual physical exam Normal physical exam today. Will check labs as below and f/u pending lab results. If labs are stable and WNL she will not need to have these rechecked for one year at her next annual physical exam. She is to call the office in the meantime if she has any acute issue, questions or concerns. - CBC w/Diff/Platelet - Comprehensive Metabolic Panel (CMET)  2. Breast cancer screening Mammogram due in Oct 2018. Patient is to call. Breast exam today was normal.   3.  Adult hypothyroidism Stable. Diagnosis pulled for medication refill. Continue current medical treatment plan. Will check labs as below and f/u pending results. - levothyroxine (LEVOTHROID) 50 MCG tablet; Take 1 tablet (50 mcg total) by mouth daily before breakfast.  Dispense: 90 tablet; Refill: 3 - TSH  4. Symptomatic menopausal or female climacteric states Discussed changing to progesterone only pill but patient declines at this time. Medication refilled as below.  - MICROGESTIN FE 1.5/30 1.5-30 MG-MCG tablet; Take 1 tablet by mouth daily.  Dispense: 3 Package; Refill: 3  5. Pure hypercholesterolemia Will check labs as below and  f/u pending results. - Lipid Profile  6. Borderline diabetes Will check labs as below and f/u pending results. - HgB A1c  7. Avitaminosis D Will check labs as below and f/u pending results. - Vitamin D (25 hydroxy)  8. BMI 27.0-27.9,adult Discussed dietary habits in great detail and increasing physical activity. Will add phentermine as below and will recheck weight in one month.  - phentermine (ADIPEX-P) 37.5 MG tablet; Take 1 tablet (37.5 mg total) by mouth daily before breakfast.  Dispense: 30 tablet; Refill: 0   --------------------------------------------------------------------    Mar Daring, PA-C  [facility]

## 2017-04-03 NOTE — Patient Instructions (Signed)

## 2017-04-09 ENCOUNTER — Telehealth: Payer: Self-pay

## 2017-04-09 LAB — COMPREHENSIVE METABOLIC PANEL
ALK PHOS: 78 IU/L (ref 39–117)
ALT: 22 IU/L (ref 0–32)
AST: 28 IU/L (ref 0–40)
Albumin/Globulin Ratio: 2.2 (ref 1.2–2.2)
Albumin: 4.6 g/dL (ref 3.5–5.5)
BUN/Creatinine Ratio: 10 (ref 9–23)
BUN: 8 mg/dL (ref 6–24)
Bilirubin Total: 0.5 mg/dL (ref 0.0–1.2)
CALCIUM: 9.4 mg/dL (ref 8.7–10.2)
CO2: 21 mmol/L (ref 20–29)
CREATININE: 0.78 mg/dL (ref 0.57–1.00)
Chloride: 100 mmol/L (ref 96–106)
GFR calc Af Amer: 101 mL/min/{1.73_m2} (ref >59)
GFR, EST NON AFRICAN AMERICAN: 88 mL/min/{1.73_m2} (ref >59)
GLUCOSE: 87 mg/dL (ref 65–99)
Globulin, Total: 2.1 g/dL (ref 1.5–4.5)
Potassium: 4.5 mmol/L (ref 3.5–5.2)
Sodium: 138 mmol/L (ref 134–144)
Total Protein: 6.7 g/dL (ref 6.0–8.5)

## 2017-04-09 LAB — LIPID PANEL
CHOL/HDL RATIO: 2.3 ratio (ref 0.0–4.4)
CHOLESTEROL TOTAL: 183 mg/dL (ref 100–199)
HDL: 81 mg/dL (ref >39)
LDL CALC: 87 mg/dL (ref 0–99)
TRIGLYCERIDES: 73 mg/dL (ref 0–149)
VLDL CHOLESTEROL CAL: 15 mg/dL (ref 5–40)

## 2017-04-09 LAB — TSH: TSH: 2.86 u[IU]/mL (ref 0.450–4.500)

## 2017-04-09 LAB — CBC WITH DIFFERENTIAL/PLATELET
BASOS ABS: 0 10*3/uL (ref 0.0–0.2)
Basos: 1 %
EOS (ABSOLUTE): 0.1 10*3/uL (ref 0.0–0.4)
EOS: 2 %
Hematocrit: 42.7 % (ref 34.0–46.6)
Hemoglobin: 14.7 g/dL (ref 11.1–15.9)
IMMATURE GRANULOCYTES: 0 %
Immature Grans (Abs): 0 10*3/uL (ref 0.0–0.1)
LYMPHS ABS: 1.5 10*3/uL (ref 0.7–3.1)
Lymphs: 29 %
MCH: 31.1 pg (ref 26.6–33.0)
MCHC: 34.4 g/dL (ref 31.5–35.7)
MCV: 90 fL (ref 79–97)
MONOS ABS: 0.4 10*3/uL (ref 0.1–0.9)
Monocytes: 8 %
NEUTROS PCT: 60 %
Neutrophils Absolute: 3.2 10*3/uL (ref 1.4–7.0)
PLATELETS: 219 10*3/uL (ref 150–379)
RBC: 4.73 x10E6/uL (ref 3.77–5.28)
RDW: 13 % (ref 12.3–15.4)
WBC: 5.3 10*3/uL (ref 3.4–10.8)

## 2017-04-09 LAB — HEMOGLOBIN A1C
Est. average glucose Bld gHb Est-mCnc: 100 mg/dL
Hgb A1c MFr Bld: 5.1 % (ref 4.8–5.6)

## 2017-04-09 LAB — VITAMIN D 25 HYDROXY (VIT D DEFICIENCY, FRACTURES): VIT D 25 HYDROXY: 42.6 ng/mL (ref 30.0–100.0)

## 2017-04-09 NOTE — Telephone Encounter (Signed)
-----   Message from Mar Daring, [place] sent at 04/09/2017  9:12 AM EDT ----- All labs are within normal limits and stable.  Thanks! -JB

## 2017-04-09 NOTE — Telephone Encounter (Signed)
Patient advised.

## 2017-05-01 ENCOUNTER — Ambulatory Visit (INDEPENDENT_AMBULATORY_CARE_PROVIDER_SITE_OTHER): Payer: BC Managed Care – PPO | Admitting: Physician Assistant

## 2017-05-01 ENCOUNTER — Encounter: Payer: Self-pay | Admitting: Physician Assistant

## 2017-05-01 VITALS — BP 140/90 | HR 108 | Temp 98.2°F | Resp 16 | Ht 65.0 in | Wt 153.6 lb

## 2017-05-01 DIAGNOSIS — Z6825 Body mass index (BMI) 25.0-25.9, adult: Secondary | ICD-10-CM | POA: Diagnosis not present

## 2017-05-01 DIAGNOSIS — Z713 Dietary counseling and surveillance: Secondary | ICD-10-CM | POA: Diagnosis not present

## 2017-05-01 MED ORDER — PHENTERMINE HCL 37.5 MG PO TABS: 37.5000 mg | ORAL_TABLET | Freq: Every day | ORAL | 0 refills | Status: DC

## 2017-05-01 NOTE — Progress Notes (Signed)
Patient: Erika May Female    DOB: [birth year]   51 y.o.   MRN: [id number] Visit Date: 05/01/2017  Today's Provider: Mar Daring, PA-C   Chief Complaint  Patient presents with  . Follow-up   Subjective:    HPI  Follow up for weight   The patient was last seen for this 4 weeks ago. Changes made at last visit include add Phentermine.  She reports excellent compliance with treatment. She feels that condition is Improved. She is not having side effects.   Wt Readings from Last 3 Encounters:  05/01/17 153 lb 9.6 oz (69.7 kg)  04/03/17 163 lb (73.9 kg)  04/02/16 149 lb (67.6 kg)   ------------------------------------------------------------------------------------    No Known Allergies   Current Outpatient Prescriptions:  .  Calcium Carb-Cholecalciferol (CALCIUM + D3) 600-200 MG-UNIT TABS, Take by mouth 3 (three) times a week. , Disp: , Rfl:  .  calcium carbonate (TUMS - DOSED IN MG ELEMENTAL CALCIUM) 500 MG chewable tablet, Chew 1 tablet by mouth as needed for indigestion or heartburn., Disp: , Rfl:  .  ibuprofen (ADVIL,MOTRIN) 200 MG tablet, Take 200 mg by mouth every 6 (six) hours as needed for headache. , Disp: , Rfl:  .  levothyroxine (LEVOTHROID) 50 MCG tablet, Take 1 tablet (50 mcg total) by mouth daily before breakfast., Disp: 90 tablet, Rfl: 3 .  loratadine (CLARITIN) 10 MG tablet, Take 10 mg by mouth daily. am, Disp: , Rfl:  .  MICROGESTIN FE 1.5/30 1.5-30 MG-MCG tablet, Take 1 tablet by mouth daily., Disp: 3 Package, Rfl: 3 .  Multiple Vitamin (MULTIVITAMIN) capsule, Take 1 capsule by mouth daily. am, Disp: , Rfl:  .  phentermine (ADIPEX-P) 37.5 MG tablet, Take 1 tablet (37.5 mg total) by mouth daily before breakfast., Disp: 30 tablet, Rfl: 0 .  Sennosides-Docusate Sodium (PERI-COLACE PO), Take by mouth as needed., Disp: , Rfl:   Review of Systems  Constitutional: Negative.   Respiratory: Negative.   Cardiovascular: Negative.     Gastrointestinal: Negative.   Neurological: Negative.   Psychiatric/Behavioral: Negative.     Social History  Substance Use Topics  . Smoking status: Never Smoker  . Smokeless tobacco: Never Used  . Alcohol use Yes     Comment: occasional/1 drink 2x week   Objective:   BP 140/90 (BP Location: Left Arm, Patient Position: Sitting, Cuff Size: Large)   Pulse (!) 108   Temp 98.2 F (36.8 C) (Oral)   Resp 16   Ht 5\' 5"  (1.651 m)   Wt 153 lb 9.6 oz (69.7 kg)   LMP 04/27/2017 (Exact Date)   BMI 25.56 kg/m  Vitals:   05/01/17 1352  BP: 140/90  Pulse: (!) 108  Resp: 16  Temp: 98.2 F (36.8 C)  TempSrc: Oral  Weight: 153 lb 9.6 oz (69.7 kg)  Height: 5\' 5"  (1.651 m)     Physical Exam  Constitutional: She appears well-developed and well-nourished. No distress.  Neck: Normal range of motion. Neck supple.  Cardiovascular: Normal rate, regular rhythm and normal heart sounds.  Exam reveals no gallop and no friction rub.   No murmur heard. Pulmonary/Chest: Effort normal and breath sounds normal. No respiratory distress. She has no wheezes. She has no rales.  Skin: She is not diaphoretic.  Vitals reviewed.       Assessment & Plan:      1. Encounter for weight loss counseling Patient as done very well with the 1st month of  phentermine. I did advise her that since she is close to normal BMI we will try one more month to allow her to get her goals and lifestyle changes in place better. She is to use this Rx of phentermine every other day or half tab daily to every other day. This way she can slowly taper to make sure she has a good grasp on portion control and trying to not over eat. She is in agreement. She is to call the office if she starts regaining weight and we need to readdress.   2. BMI 25.0-25.9,adult See above medical treatment plan. - phentermine (ADIPEX-P) 37.5 MG tablet; Take 1 tablet (37.5 mg total) by mouth daily before breakfast.  Dispense: 30 tablet; Refill: 0        Mar Daring, PA-C  [facility]

## 2017-05-01 NOTE — Patient Instructions (Signed)
Exercising to Lose Weight Exercising can help you to lose weight. In order to lose weight through exercise, you need to do vigorous-intensity exercise. You can tell that you are exercising with vigorous intensity if you are breathing very hard and fast and cannot hold a conversation while exercising. Moderate-intensity exercise helps to maintain your current weight. You can tell that you are exercising at a moderate level if you have a higher heart rate and faster breathing, but you are still able to hold a conversation. How often should I exercise? Choose an activity that you enjoy and set realistic goals. Your health care provider can help you to make an activity plan that works for you. Exercise regularly as directed by your health care provider. This may include:  Doing resistance training twice each week, such as: ? Push-ups. ? Sit-ups. ? Lifting weights. ? Using resistance bands.  Doing a given intensity of exercise for a given amount of time. Choose from these options: ? 150 minutes of moderate-intensity exercise every week. ? 75 minutes of vigorous-intensity exercise every week. ? A mix of moderate-intensity and vigorous-intensity exercise every week.  Children, pregnant women, people who are out of shape, people who are overweight, and older adults may need to consult a health care provider for individual recommendations. If you have any sort of medical condition, be sure to consult your health care provider before starting a new exercise program. What are some activities that can help me to lose weight?  Walking at a rate of at least 4.5 miles an hour.  Jogging or running at a rate of 5 miles per hour.  Biking at a rate of at least 10 miles per hour.  Lap swimming.  Roller-skating or in-line skating.  Cross-country skiing.  Vigorous competitive sports, such as football, basketball, and soccer.  Jumping rope.  Aerobic dancing. How can I be more active in my day-to-day  activities?  Use the stairs instead of the elevator.  Take a walk during your lunch break.  If you drive, park your car farther away from work or school.  If you take public transportation, get off one stop early and walk the rest of the way.  Make all of your phone calls while standing up and walking around.  Get up, stretch, and walk around every 30 minutes throughout the day. What guidelines should I follow while exercising?  Do not exercise so much that you hurt yourself, feel dizzy, or get very short of breath.  Consult your health care provider prior to starting a new exercise program.  Wear comfortable clothes and shoes with good support.  Drink plenty of water while you exercise to prevent dehydration or heat stroke. Body water is lost during exercise and must be replaced.  Work out until you breathe faster and your heart beats faster. This information is not intended to replace advice given to you by your health care provider. Make sure you discuss any questions you have with your health care provider. Document Released: 11/16/2010 Document Revised: 03/21/2016 Document Reviewed: 03/17/2014 Elsevier Interactive Patient Education  2018 Elsevier Inc.  

## 2017-10-17 ENCOUNTER — Ambulatory Visit: Payer: BC Managed Care – PPO | Admitting: Family Medicine

## 2017-10-17 ENCOUNTER — Encounter: Payer: Self-pay | Admitting: Family Medicine

## 2017-10-17 VITALS — BP 136/84 | HR 80 | Temp 98.4°F | Ht 65.0 in | Wt 159.6 lb

## 2017-10-17 DIAGNOSIS — J069 Acute upper respiratory infection, unspecified: Secondary | ICD-10-CM

## 2017-10-17 DIAGNOSIS — J029 Acute pharyngitis, unspecified: Secondary | ICD-10-CM

## 2017-10-17 LAB — POCT RAPID STREP A (OFFICE): RAPID STREP A SCREEN: NEGATIVE

## 2017-10-17 MED ORDER — FIRST-DUKES MOUTHWASH MT SUSP: OROMUCOSAL | 0 refills | Status: DC

## 2017-10-17 NOTE — Progress Notes (Signed)
Patient: Erika May Female    DOB: May 20, 1966   51 y.o.   MRN: [id number] Visit Date: 10/17/2017  Today's Provider: Vernie Murders, PA   Chief Complaint  Patient presents with  . Sore Throat  . Cough  . congestion   Subjective:    URI   This is a new problem. Episode onset: 2 weeks. The problem has been waxing and waning. There has been no fever. Associated symptoms include congestion, coughing and a sore throat. Sinus pain: sinus pressure. Treatments tried: DyQuil,NyQuil and Mucinex.   Past Medical History:  Diagnosis Date  . 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 DDD (degenerative disc disease), lumbar   . GERD (gastroesophageal reflux disease)    heartburn occasional  . Headache    1/week  . Hypothyroidism   . Incontinence   . Joint stiffness   . Rhinitis, allergic    Past Surgical History:  Procedure Laterality Date  . AUGMENTATION MAMMAPLASTY Bilateral 2355   silicone  . BREAST CYST EXCISION Bilateral 1990's   benign  . CERVICAL BIOPSY  W/ LOOP ELECTRODE EXCISION  10/2009  . COLONOSCOPY N/A 05/08/2015   Procedure: COLONOSCOPY;  Surgeon: Lucilla Lame, MD;  Location: [place];  Service: Gastroenterology;  Laterality: N/A;  . WISDOM TOOTH EXTRACTION     Family History  Problem Relation Age of Onset  . Arthritis Mother   . Hypothyroidism Mother   . Hypertension Mother   . Hyperlipidemia Mother   . Transient ischemic attack Mother   . Melanoma Father   . Hyperlipidemia Father   . Cataracts Father   . Emphysema Father   . Healthy Sister   . Breast cancer Maternal Aunt        great   No Known Allergies  Current Outpatient Medications:  .  Calcium Carb-Cholecalciferol (CALCIUM + D3) 600-200 MG-UNIT TABS, Take by mouth 3 (three) times a week. , Disp: , Rfl:  .  calcium carbonate (TUMS - DOSED IN MG ELEMENTAL CALCIUM) 500 MG chewable tablet, Chew 1 tablet by  mouth as needed for indigestion or heartburn., Disp: , Rfl:  .  ibuprofen (ADVIL,MOTRIN) 200 MG tablet, Take 200 mg by mouth every 6 (six) hours as needed for headache. , Disp: , Rfl:  .  levothyroxine (LEVOTHROID) 50 MCG tablet, Take 1 tablet (50 mcg total) by mouth daily before breakfast., Disp: 90 tablet, Rfl: 3 .  loratadine (CLARITIN) 10 MG tablet, Take 10 mg by mouth daily. am, Disp: , Rfl:  .  MICROGESTIN FE 1.5/30 1.5-30 MG-MCG tablet, Take 1 tablet by mouth daily., Disp: 3 Package, Rfl: 3 .  Multiple Vitamin (MULTIVITAMIN) capsule, Take 1 capsule by mouth daily. am, Disp: , Rfl:  .  Sennosides-Docusate Sodium (PERI-COLACE PO), Take by mouth as needed., Disp: , Rfl:   Review of Systems  Constitutional: Negative.   HENT: Positive for congestion and sore throat. Sinus pain: sinus pressure.   Respiratory: Positive for cough.   Cardiovascular: Negative.     Social History   Tobacco Use  . Smoking status: Never Smoker  . Smokeless tobacco: Never Used  Substance Use Topics  . Alcohol use: Yes    Comment: occasional/1 drink 2x week   Objective:   BP 136/84 (BP Location: Right Arm, Patient Position: Sitting, Cuff Size: Normal)   Pulse 80   Temp 98.4 F (36.9 C) (Oral)  Ht 5\' 5"  (1.651 m)   Wt 159 lb 9.6 oz (72.4 kg)   SpO2 99%   BMI 26.56 kg/m    Physical Exam  Constitutional: She is oriented to person, place, and time. She appears well-developed and well-nourished. No distress.  HENT:  Head: Normocephalic and atraumatic.  Right Ear: Hearing and external ear normal.  Left Ear: Hearing and external ear normal.  Nose: Nose normal.  Sinuses transilluminate well. Posterior pharynx red and cobblestone appearing without exudates.  Eyes: Conjunctivae and lids are normal. Right eye exhibits no discharge. Left eye exhibits no discharge. No scleral icterus.  Neck: Neck supple.  Cardiovascular: Normal rate and regular rhythm.  Pulmonary/Chest: Effort normal and breath sounds  normal. No respiratory distress.  Abdominal: Soft.  Musculoskeletal: Normal range of motion.  Lymphadenopathy:    She has no cervical adenopathy.  Neurological: She is alert and oriented to person, place, and time.  Skin: Skin is intact. No lesion and no rash noted.  Psychiatric: She has a normal mood and affect. Her speech is normal and behavior is normal. Thought content normal.      Assessment & Plan:     1. Sore throat Onset over the past 2 weeks with some cough and PND. Strep test negative. Will treat with [employer]'s Magic Mouthwash and may use throat lozenge prn. Recheck if no better in 5 days. - POCT rapid strep A  2. URI with cough and congestion Onset over the past couple weeks. No fever or purulent sputum production. May use Mucinex-DM or Dayquil prn symptoms. Suspect viral illness. Increase fluid intake and recheck prn.       Vernie Murders, PA  [facility]

## 2017-11-04 ENCOUNTER — Other Ambulatory Visit: Payer: Self-pay | Admitting: Physician Assistant

## 2017-11-04 DIAGNOSIS — Z1231 Encounter for screening mammogram for malignant neoplasm of breast: Secondary | ICD-10-CM

## 2017-11-18 ENCOUNTER — Ambulatory Visit
Admission: RE | Admit: 2017-11-18 | Discharge: 2017-11-18 | Disposition: A | Discharge status: Home / Self Care | Payer: BC Managed Care – PPO | Source: Ambulatory Visit | Attending: Physician Assistant | Admitting: Physician Assistant

## 2017-11-18 DIAGNOSIS — Z1231 Encounter for screening mammogram for malignant neoplasm of breast: Secondary | ICD-10-CM | POA: Diagnosis present

## 2018-02-10 ENCOUNTER — Other Ambulatory Visit: Payer: Self-pay | Admitting: Physician Assistant

## 2018-02-10 DIAGNOSIS — Z0000 Encounter for general adult medical examination without abnormal findings: Secondary | ICD-10-CM

## 2018-02-10 MED ORDER — MICROGESTIN FE 1.5/30 1.5-30 MG-MCG PO TABS: 1.0000 | ORAL_TABLET | Freq: Every day | ORAL | 3 refills | Status: DC

## 2018-02-10 NOTE — Telephone Encounter (Signed)
Please review. Thanks!  

## 2018-02-10 NOTE — Telephone Encounter (Signed)
Pt contacted office for refill request on the following medications:  MICROGESTIN FE 1.5/30 1.5-30 MG-MCG tablet  [facility] [address]  Last Rx: 04/03/17 Please advise. Thanks TNP

## 2018-04-06 ENCOUNTER — Ambulatory Visit (INDEPENDENT_AMBULATORY_CARE_PROVIDER_SITE_OTHER): Payer: BC Managed Care – PPO | Admitting: Physician Assistant

## 2018-04-06 ENCOUNTER — Encounter: Payer: Self-pay | Admitting: Physician Assistant

## 2018-04-06 VITALS — BP 138/90 | HR 86 | Temp 98.3°F | Resp 20 | Ht 65.0 in | Wt 161.0 lb

## 2018-04-06 DIAGNOSIS — Z124 Encounter for screening for malignant neoplasm of cervix: Secondary | ICD-10-CM

## 2018-04-06 DIAGNOSIS — Z6826 Body mass index (BMI) 26.0-26.9, adult: Secondary | ICD-10-CM

## 2018-04-06 DIAGNOSIS — E039 Hypothyroidism, unspecified: Secondary | ICD-10-CM | POA: Diagnosis not present

## 2018-04-06 DIAGNOSIS — E559 Vitamin D deficiency, unspecified: Secondary | ICD-10-CM | POA: Diagnosis not present

## 2018-04-06 DIAGNOSIS — Z1239 Encounter for other screening for malignant neoplasm of breast: Secondary | ICD-10-CM

## 2018-04-06 DIAGNOSIS — E7800 Pure hypercholesterolemia, unspecified: Secondary | ICD-10-CM | POA: Diagnosis not present

## 2018-04-06 DIAGNOSIS — Z114 Encounter for screening for human immunodeficiency virus [HIV]: Secondary | ICD-10-CM | POA: Diagnosis not present

## 2018-04-06 DIAGNOSIS — R7303 Prediabetes: Secondary | ICD-10-CM

## 2018-04-06 DIAGNOSIS — Z1231 Encounter for screening mammogram for malignant neoplasm of breast: Secondary | ICD-10-CM | POA: Diagnosis not present

## 2018-04-06 DIAGNOSIS — Z0000 Encounter for general adult medical examination without abnormal findings: Secondary | ICD-10-CM | POA: Diagnosis not present

## 2018-04-06 NOTE — Patient Instructions (Signed)

## 2018-04-06 NOTE — Progress Notes (Signed)
Patient: Erika May, Female    DOB: 1966/10/07, 52 y.o.   MRN: [id number] Visit Date: 04/06/2018  Today's Provider: Mar Daring, PA-C   Chief Complaint  Patient presents with  . Annual Exam   Subjective:    Annual physical exam Erika May Hy is a 52 y.o. female who presents today for health maintenance and complete physical. She feels well. She reports exercising 3-4 days. She reports she is sleeping well.  04/03/17 CPE 01/19/14 Pap-neg HPV-negative 11/18/17 Mammogram-BI-RADS 1 05/08/15 Colonoscopy-Diverticulosis, recheck 10 years -----------------------------------------------------------------   Review of Systems  Constitutional: Negative.   HENT: Positive for congestion and postnasal drip.   Eyes: Negative.   Respiratory: Positive for cough.   Cardiovascular: Negative.   Gastrointestinal: Negative.   Endocrine: Negative.   Genitourinary: Positive for urgency.  Musculoskeletal: Positive for arthralgias, back pain and neck pain.       Per pt under Chiropractic care  Skin: Negative.   Allergic/Immunologic: Positive for environmental allergies.  Neurological: Negative.   Hematological: Negative.   Psychiatric/Behavioral: Negative.     Social History      She  reports that she has never smoked. She has never used smokeless tobacco. She reports that she drinks alcohol. She reports that she does not use drugs.       Social History   Socioeconomic History  . Marital status: Married    Spouse name: Not on file  . Number of children: Not on file  . Years of education: Not on file  . Highest education level: Not on file  Occupational History  . Not on file  Social Needs  . Financial resource strain: Not on file  . Food insecurity:    Worry: Not on file    Inability: Not on file  . Transportation needs:    Medical: Not on file    Non-medical: Not on file  Tobacco Use  . Smoking status: Never Smoker  . Smokeless tobacco: Never  Used  Substance and Sexual Activity  . Alcohol use: Yes    Comment: occasional/1 drink 2x week  . Drug use: No  . Sexual activity: Yes    Birth control/protection: Pill  Lifestyle  . Physical activity:    Days per week: Not on file    Minutes per session: Not on file  . Stress: Not on file  Relationships  . Social connections:    Talks on phone: Not on file    Gets together: Not on file    Attends religious service: Not on file    Active member of club or organization: Not on file    Attends meetings of clubs or organizations: Not on file    Relationship status: Not on file  Other Topics Concern  . Not on file  Social History Narrative  . Not on file    Past Medical History:  Diagnosis Date  . 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 DDD (degenerative disc disease), lumbar   . GERD (gastroesophageal reflux disease)    heartburn occasional  . Headache    1/week  . Hypothyroidism   . Incontinence   . Joint stiffness   . Rhinitis, allergic      Patient Active Problem List   Diagnosis Date Noted  . BMI 27.0-27.9,adult 04/03/2017  . HLD (hyperlipidemia) 03/13/2016  . Family planning 03/13/2016  . Benign neoplasm of  skin 03/13/2016  . Blood pressure elevated 03/13/2016  . Symptomatic menopausal or female climacteric states 03/13/2016  . Borderline diabetes 03/13/2016  . Contact dermatitis due to Genus Toxicodendron 03/13/2016  . Avitaminosis D 03/13/2016  . Adult hypothyroidism 10/09/2015  . First degree hemorrhoids   . Fibroid 04/15/2008  . Allergic rhinitis 08/10/2007  . Narrowing of intervertebral disc space 08/10/2007    Past Surgical History:  Procedure Laterality Date  . AUGMENTATION MAMMAPLASTY Bilateral 0867   silicone  . BREAST CYST EXCISION Bilateral 1990's   benign  . CERVICAL BIOPSY  W/ LOOP ELECTRODE EXCISION  10/2009  . COLONOSCOPY N/A 05/08/2015   Procedure: COLONOSCOPY;   Surgeon: Lucilla Lame, MD;  Location: [facility];  Service: Gastroenterology;  Laterality: N/A;  . WISDOM TOOTH EXTRACTION      Family History        Family Status  Relation Name Status  . Mother  Alive  . Father  Deceased at age 12  . Sister  Alive  . Mat Aunt  (Not Specified)        Her family history includes Arthritis in her mother; Breast cancer in her maternal aunt; Cataracts in her father; Emphysema in her father; Healthy in her sister; Hyperlipidemia in her father and mother; Hypertension in her mother; Hypothyroidism in her mother; Melanoma in her father; Transient ischemic attack in her mother.      No Known Allergies   Current Outpatient Medications:  .  Calcium Carb-Cholecalciferol (CALCIUM + D3) 600-200 MG-UNIT TABS, Take by mouth 3 (three) times a week. , Disp: , Rfl:  .  calcium carbonate (TUMS - DOSED IN MG ELEMENTAL CALCIUM) 500 MG chewable tablet, Chew 1 tablet by mouth as needed for indigestion or heartburn., Disp: , Rfl:  .  ibuprofen (ADVIL,MOTRIN) 200 MG tablet, Take 200 mg by mouth every 6 (six) hours as needed for headache. , Disp: , Rfl:  .  levothyroxine (LEVOTHROID) 50 MCG tablet, Take 1 tablet (50 mcg total) by mouth daily before breakfast., Disp: 90 tablet, Rfl: 3 .  loratadine (CLARITIN) 10 MG tablet, Take 10 mg by mouth daily. am, Disp: , Rfl:  .  MICROGESTIN FE 1.5/30 1.5-30 MG-MCG tablet, Take 1 tablet by mouth daily., Disp: 3 Package, Rfl: 3 .  Multiple Vitamin (MULTIVITAMIN) capsule, Take 1 capsule by mouth daily. am, Disp: , Rfl:  .  Sennosides-Docusate Sodium (PERI-COLACE PO), Take by mouth as needed., Disp: , Rfl:    Patient Care Team: Mar Daring, PA-C as PCP - General (Family Medicine)      Objective:   Vitals: BP 138/90 (BP Location: Left Arm, Patient Position: Sitting, Cuff Size: Normal)   Pulse 86   Temp 98.3 F (36.8 C) (Oral)   Resp 20   Ht 5\' 5"  (1.651 m)   Wt 161 lb (73 kg)   SpO2 96%   BMI 26.79 kg/m     Vitals:   04/06/18 1515  BP: 138/90  Pulse: 86  Resp: 20  Temp: 98.3 F (36.8 C)  TempSrc: Oral  SpO2: 96%  Weight: 161 lb (73 kg)  Height: 5\' 5"  (1.651 m)     Physical Exam  Constitutional: She is oriented to person, place, and time. She appears well-developed and well-nourished. No distress.  HENT:  Head: Normocephalic and atraumatic.  Right Ear: Hearing, tympanic membrane, external ear and ear canal normal.  Left Ear: Hearing, tympanic membrane, external ear and ear canal normal.  Nose: Nose normal.  Mouth/Throat: Uvula is  midline, oropharynx is clear and moist and mucous membranes are normal. No oropharyngeal exudate.  Eyes: Pupils are equal, round, and reactive to light. Conjunctivae and EOM are normal. Right eye exhibits no discharge. Left eye exhibits no discharge. No scleral icterus.  Neck: Normal range of motion. Neck supple. No JVD present. Carotid bruit is not present. No tracheal deviation present. No thyromegaly present.  Cardiovascular: Normal rate, regular rhythm, normal heart sounds and intact distal pulses. Exam reveals no gallop and no friction rub.  No murmur heard. Pulmonary/Chest: Effort normal and breath sounds normal. No respiratory distress. She has no wheezes. She has no rales. She exhibits no tenderness. Right breast exhibits no inverted nipple, no mass, no nipple discharge, no skin change and no tenderness. Left breast exhibits no inverted nipple, no mass, no nipple discharge, no skin change and no tenderness. No breast tenderness, discharge or bleeding. Breasts are symmetrical.  Abdominal: Soft. Bowel sounds are normal. She exhibits no distension and no mass. There is no tenderness. There is no rebound and no guarding. Hernia confirmed negative in the right inguinal area and confirmed negative in the left inguinal area.  Genitourinary: Rectum normal, vagina normal and uterus normal. No breast tenderness, discharge or bleeding. Pelvic exam was performed with  patient supine. There is no rash, tenderness, lesion or injury on the right labia. There is no rash, tenderness, lesion or injury on the left labia. Cervix exhibits no motion tenderness, no discharge and no friability. Right adnexum displays no mass, no tenderness and no fullness. Left adnexum displays no mass, no tenderness and no fullness. No erythema, tenderness or bleeding in the vagina. No signs of injury around the vagina. No vaginal discharge found.  Musculoskeletal: Normal range of motion. She exhibits no edema or tenderness.  Lymphadenopathy:    She has no cervical adenopathy.       Right: No inguinal adenopathy present.       Left: No inguinal adenopathy present.  Neurological: She is alert and oriented to person, place, and time. She has normal reflexes. No cranial nerve deficit. Coordination normal.  Skin: Skin is warm and dry. No rash noted. She is not diaphoretic.  Psychiatric: She has a normal mood and affect. Her behavior is normal. Judgment and thought content normal.  Vitals reviewed.    Depression Screen PHQ 2/9 Scores 04/06/2018 04/03/2017 04/02/2016  PHQ - 2 Score 0 0 0  PHQ- 9 Score 0 4 -      Assessment & Plan:     Routine Health Maintenance and Physical Exam  Exercise Activities and Dietary recommendations Goals    None      Immunization History  Administered Date(s) Administered  . Influenza-Unspecified 07/25/2017  . Tdap 04/02/2016    Health Maintenance  Topic Date Due  . HIV Screening  10/09/1979  . PAP SMEAR  01/19/2017  . INFLUENZA VACCINE  05/28/2018  . MAMMOGRAM  11/19/2019  . COLONOSCOPY  05/07/2025  . TETANUS/TDAP  04/02/2026  . Hepatitis C Screening  Completed     Discussed health benefits of physical activity, and encouraged her to engage in regular exercise appropriate for her age and condition.    1. Annual physical exam Normal physical exam today. Will check labs as below and f/u pending lab results. If labs are stable and WNL she  will not need to have these rechecked for one year at her next annual physical exam. She is to call the office in the meantime if she has any acute issue,  questions or concerns.  2. Breast cancer screening Mammogram in January 2019 normal. Repeat annually. Breast exam today normal. Continue self breast exams. No family history of breast cancer.  3. Cervical cancer screening Pap collected today. Will send as below and f/u pending results. - Pap IG and HPV (high risk) DNA detection  4. Adult hypothyroidism Continue levothyroxine 13mcg. Will check labs as below and f/u pending results. - TSH  5. Pure hypercholesterolemia Diet controlled. Will check labs as below and f/u pending results. - CBC with Differential/Platelet - Comprehensive metabolic panel - Lipid Panel With LDL/HDL Ratio  6. Borderline diabetes Diet controlled. Will check labs as below and f/u pending results. - CBC with Differential/Platelet - Comprehensive metabolic panel - Hemoglobin A1c  7. Avitaminosis D Will check labs as below and f/u pending results. - VITAMIN D 25 Hydroxy (Vit-D Deficiency, Fractures)  8. BMI 26.0-26.9,adult Counseled patient on healthy lifestyle modifications including dieting and exercise.  - CBC with Differential/Platelet - Comprehensive metabolic panel - Hemoglobin A1c - Lipid Panel With LDL/HDL Ratio  9. Screening for HIV without presence of risk factors - HIV antibody  --------------------------------------------------------------------    Mar Daring, PA-C  [facility]

## 2018-04-09 ENCOUNTER — Telehealth: Payer: Self-pay

## 2018-04-09 LAB — PAP IG AND HPV HIGH-RISK
HPV, high-risk: NEGATIVE
PAP SMEAR COMMENT: 0

## 2018-04-09 NOTE — Telephone Encounter (Signed)
Patient advised as directed below.  Thanks,  -Joseline 

## 2018-04-09 NOTE — Telephone Encounter (Signed)
-----   Message from Mar Daring, PA-C sent at 04/09/2018  8:52 AM EDT ----- Pap is normal, HPV negative.  Will repeat in 3-5 years.

## 2018-04-14 ENCOUNTER — Telehealth: Payer: Self-pay

## 2018-04-14 LAB — CBC WITH DIFFERENTIAL/PLATELET
Basophils Absolute: 0 10*3/uL (ref 0.0–0.2)
Basos: 1 %
EOS (ABSOLUTE): 0.1 10*3/uL (ref 0.0–0.4)
EOS: 2 %
HEMATOCRIT: 40.7 % (ref 34.0–46.6)
HEMOGLOBIN: 14.1 g/dL (ref 11.1–15.9)
IMMATURE GRANULOCYTES: 0 %
Immature Grans (Abs): 0 10*3/uL (ref 0.0–0.1)
LYMPHS: 31 %
Lymphocytes Absolute: 1.7 10*3/uL (ref 0.7–3.1)
MCH: 31.5 pg (ref 26.6–33.0)
MCHC: 34.6 g/dL (ref 31.5–35.7)
MCV: 91 fL (ref 79–97)
MONOCYTES: 8 %
MONOS ABS: 0.5 10*3/uL (ref 0.1–0.9)
NEUTROS PCT: 58 %
Neutrophils Absolute: 3.2 10*3/uL (ref 1.4–7.0)
Platelets: 245 10*3/uL (ref 150–450)
RBC: 4.47 x10E6/uL (ref 3.77–5.28)
RDW: 13.2 % (ref 12.3–15.4)
WBC: 5.4 10*3/uL (ref 3.4–10.8)

## 2018-04-14 LAB — COMPREHENSIVE METABOLIC PANEL
ALT: 10 IU/L (ref 0–32)
AST: 17 IU/L (ref 0–40)
Albumin/Globulin Ratio: 2.1 (ref 1.2–2.2)
Albumin: 4.5 g/dL (ref 3.5–5.5)
Alkaline Phosphatase: 61 IU/L (ref 39–117)
BUN/Creatinine Ratio: 10 (ref 9–23)
BUN: 8 mg/dL (ref 6–24)
Bilirubin Total: 0.3 mg/dL (ref 0.0–1.2)
CALCIUM: 9.1 mg/dL (ref 8.7–10.2)
CO2: 21 mmol/L (ref 20–29)
CREATININE: 0.82 mg/dL (ref 0.57–1.00)
Chloride: 103 mmol/L (ref 96–106)
GFR calc Af Amer: 94 mL/min/{1.73_m2} (ref >59)
GFR, EST NON AFRICAN AMERICAN: 82 mL/min/{1.73_m2} (ref >59)
GLOBULIN, TOTAL: 2.1 g/dL (ref 1.5–4.5)
Glucose: 91 mg/dL (ref 65–99)
Potassium: 4 mmol/L (ref 3.5–5.2)
SODIUM: 138 mmol/L (ref 134–144)
Total Protein: 6.6 g/dL (ref 6.0–8.5)

## 2018-04-14 LAB — LIPID PANEL WITH LDL/HDL RATIO
CHOLESTEROL TOTAL: 182 mg/dL (ref 100–199)
HDL: 64 mg/dL (ref >39)
LDL Calculated: 90 mg/dL (ref 0–99)
LDl/HDL Ratio: 1.4 ratio (ref 0.0–3.2)
TRIGLYCERIDES: 140 mg/dL (ref 0–149)
VLDL Cholesterol Cal: 28 mg/dL (ref 5–40)

## 2018-04-14 LAB — HIV ANTIBODY (ROUTINE TESTING W REFLEX): HIV SCREEN 4TH GENERATION: NONREACTIVE

## 2018-04-14 LAB — VITAMIN D 25 HYDROXY (VIT D DEFICIENCY, FRACTURES): Vit D, 25-Hydroxy: 40.9 ng/mL (ref 30.0–100.0)

## 2018-04-14 LAB — TSH: TSH: 3.22 u[IU]/mL (ref 0.450–4.500)

## 2018-04-14 LAB — HEMOGLOBIN A1C
ESTIMATED AVERAGE GLUCOSE: 100 mg/dL
Hgb A1c MFr Bld: 5.1 % (ref 4.8–5.6)

## 2018-04-14 NOTE — Telephone Encounter (Signed)
Viewed by Ralph Leyden on 04/14/2018 8:31 AM

## 2018-04-14 NOTE — Telephone Encounter (Signed)
-----   Message from Mar Daring, PA-C sent at 04/14/2018  8:29 AM EDT ----- All labs are within normal limits and stable.  Thanks! -JB

## 2018-04-20 ENCOUNTER — Other Ambulatory Visit: Payer: Self-pay | Admitting: Physician Assistant

## 2018-04-20 DIAGNOSIS — E039 Hypothyroidism, unspecified: Secondary | ICD-10-CM

## 2018-06-11 ENCOUNTER — Encounter: Payer: Self-pay | Admitting: Physician Assistant

## 2018-06-15 ENCOUNTER — Encounter: Payer: Self-pay | Admitting: Physician Assistant

## 2018-06-15 ENCOUNTER — Ambulatory Visit: Payer: BC Managed Care – PPO | Admitting: Physician Assistant

## 2018-06-15 VITALS — BP 120/80 | HR 83 | Temp 98.0°F | Resp 16 | Ht 64.0 in | Wt 161.0 lb

## 2018-06-15 DIAGNOSIS — Z713 Dietary counseling and surveillance: Secondary | ICD-10-CM | POA: Diagnosis not present

## 2018-06-15 DIAGNOSIS — Z6827 Body mass index (BMI) 27.0-27.9, adult: Secondary | ICD-10-CM | POA: Diagnosis not present

## 2018-06-15 MED ORDER — PHENTERMINE HCL 37.5 MG PO TABS: 37.5000 mg | ORAL_TABLET | Freq: Every day | ORAL | 2 refills | Status: DC

## 2018-06-15 NOTE — Progress Notes (Signed)
Patient: Erika May Female    DOB: Feb 28, 1966   52 y.o.   MRN: [id number] Visit Date: 06/15/2018  Today's Provider: Mar Daring, PA-C   Chief Complaint  Patient presents with  . Obesity   Subjective:    HPI Patient is here today and reports she would like to talk about obesity. She reports she walks 2-3 miles daily. She reports she has tried the Nutrisystem and counting calories and is not working.she has also tried Phentermine in the past and was successful.     No Known Allergies   Current Outpatient Medications:  .  Calcium Carb-Cholecalciferol (CALCIUM + D3) 600-200 MG-UNIT TABS, Take by mouth 3 (three) times a week. , Disp: , Rfl:  .  calcium carbonate (TUMS - DOSED IN MG ELEMENTAL CALCIUM) 500 MG chewable tablet, Chew 1 tablet by mouth as needed for indigestion or heartburn., Disp: , Rfl:  .  ibuprofen (ADVIL,MOTRIN) 200 MG tablet, Take 200 mg by mouth every 6 (six) hours as needed for headache. , Disp: , Rfl:  .  levothyroxine (SYNTHROID, LEVOTHROID) 50 MCG tablet, TAKE 1 TABLET BY MOUTH ONCE DAILY BEFORE BREAKFAST, Disp: 90 tablet, Rfl: 3 .  loratadine (CLARITIN) 10 MG tablet, Take 10 mg by mouth daily. am, Disp: , Rfl:  .  MICROGESTIN FE 1.5/30 1.5-30 MG-MCG tablet, Take 1 tablet by mouth daily., Disp: 3 Package, Rfl: 3 .  Multiple Vitamin (MULTIVITAMIN) capsule, Take 1 capsule by mouth daily. am, Disp: , Rfl:  .  Sennosides-Docusate Sodium (PERI-COLACE PO), Take by mouth as needed., Disp: , Rfl:   Review of Systems  Constitutional: Negative.   Respiratory: Negative.   Cardiovascular: Negative.   Gastrointestinal: Negative.   Neurological: Negative.     Social History   Tobacco Use  . Smoking status: Never Smoker  . Smokeless tobacco: Never Used  Substance Use Topics  . Alcohol use: Yes    Comment: occasional/1 drink 2x week   Objective:   BP 120/80 (BP Location: Left Arm, Patient Position: Sitting, Cuff Size: Normal)   Pulse 83    Temp 98 F (36.7 C) (Oral)   Resp 16   Ht 5\' 4"  (1.626 m)   Wt 161 lb (73 kg)   LMP  (Within Weeks)   SpO2 98%   BMI 27.64 kg/m  Vitals:   06/15/18 1322  BP: 120/80  Pulse: 83  Resp: 16  Temp: 98 F (36.7 C)  TempSrc: Oral  SpO2: 98%  Weight: 161 lb (73 kg)  Height: 5\' 4"  (1.626 m)     Physical Exam  Constitutional: She appears well-developed and well-nourished. No distress.  Neck: Normal range of motion. Neck supple.  Cardiovascular: Normal rate, regular rhythm and normal heart sounds. Exam reveals no gallop and no friction rub.  No murmur heard. Pulmonary/Chest: Effort normal and breath sounds normal. No respiratory distress. She has no wheezes. She has no rales.  Skin: She is not diaphoretic.  Vitals reviewed.       Assessment & Plan:     1. Encounter for weight loss counseling Will restart phentermine as below. Patient is already doing Nutrisystem for portion control and walking her driveway daily. I will see her back in 3 months if desired for weight recheck.  - phentermine (ADIPEX-P) 37.5 MG tablet; Take 1 tablet (37.5 mg total) by mouth daily before breakfast.  Dispense: 30 tablet; Refill: 2  2. BMI 27.0-27.9,adult See above medical treatment plan. - phentermine (ADIPEX-P) 37.5  MG tablet; Take 1 tablet (37.5 mg total) by mouth daily before breakfast.  Dispense: 30 tablet; Refill: [place], PA-C  [facility]

## 2018-08-20 ENCOUNTER — Other Ambulatory Visit: Payer: Self-pay | Admitting: Physician Assistant

## 2018-08-20 DIAGNOSIS — Z6827 Body mass index (BMI) 27.0-27.9, adult: Secondary | ICD-10-CM

## 2018-08-20 DIAGNOSIS — Z713 Dietary counseling and surveillance: Secondary | ICD-10-CM

## 2018-08-20 NOTE — Telephone Encounter (Signed)
Patient requesting refills on the following medication: Phentermine She was last seen for this 06/15/18 and is to follow-up around 09/15/18

## 2018-09-04 ENCOUNTER — Ambulatory Visit: Payer: BC Managed Care – PPO | Admitting: Physician Assistant

## 2018-09-07 ENCOUNTER — Ambulatory Visit: Payer: BC Managed Care – PPO | Admitting: Physician Assistant

## 2018-09-07 ENCOUNTER — Encounter: Payer: Self-pay | Admitting: Physician Assistant

## 2018-09-07 DIAGNOSIS — Z6827 Body mass index (BMI) 27.0-27.9, adult: Secondary | ICD-10-CM | POA: Diagnosis not present

## 2018-09-07 DIAGNOSIS — Z713 Dietary counseling and surveillance: Secondary | ICD-10-CM | POA: Diagnosis not present

## 2018-09-07 MED ORDER — PHENTERMINE HCL 37.5 MG PO TABS: ORAL_TABLET | ORAL | 2 refills | Status: DC

## 2018-09-07 NOTE — Progress Notes (Signed)
Patient: Erika May Female    DOB: 1966/06/19   52 y.o.   MRN: [id number] Visit Date: 09/07/2018  Today's Provider: Mar Daring, PA-C   No chief complaint on file.  Subjective:    HPI Patient here today for 3 month follow-up weight Loss counseling. Phentermine was restarted. Patient exercising and doing portion control.  Wt Readings from Last 3 Encounters:  09/07/18 153 lb (69.4 kg)  06/15/18 161 lb (73 kg)  04/06/18 161 lb (73 kg)      No Known Allergies   Current Outpatient Medications:  .  Calcium Carb-Cholecalciferol (CALCIUM + D3) 600-200 MG-UNIT TABS, Take by mouth 3 (three) times a week. , Disp: , Rfl:  .  calcium carbonate (TUMS - DOSED IN MG ELEMENTAL CALCIUM) 500 MG chewable tablet, Chew 1 tablet by mouth as needed for indigestion or heartburn., Disp: , Rfl:  .  ibuprofen (ADVIL,MOTRIN) 200 MG tablet, Take 200 mg by mouth every 6 (six) hours as needed for headache. , Disp: , Rfl:  .  levothyroxine (SYNTHROID, LEVOTHROID) 50 MCG tablet, TAKE 1 TABLET BY MOUTH ONCE DAILY BEFORE BREAKFAST, Disp: 90 tablet, Rfl: 3 .  loratadine (CLARITIN) 10 MG tablet, Take 10 mg by mouth daily. am, Disp: , Rfl:  .  MICROGESTIN FE 1.5/30 1.5-30 MG-MCG tablet, Take 1 tablet by mouth daily., Disp: 3 Package, Rfl: 3 .  Multiple Vitamin (MULTIVITAMIN) capsule, Take 1 capsule by mouth daily. am, Disp: , Rfl:  .  phentermine (ADIPEX-P) 37.5 MG tablet, TAKE 1 TABLET BY MOUTH DAILY BEFORE BREAKFAST, Disp: 30 tablet, Rfl: 0 .  Sennosides-Docusate Sodium (PERI-COLACE PO), Take by mouth as needed., Disp: , Rfl:   Review of Systems  Constitutional: Negative.   Respiratory: Negative.   Cardiovascular: Negative.   Gastrointestinal: Negative.   Neurological: Negative.     Social History   Tobacco Use  . Smoking status: Never Smoker  . Smokeless tobacco: Never Used  Substance Use Topics  . Alcohol use: Yes    Comment: occasional/1 drink 2x week   Objective:   BP  (!) 150/90 (BP Location: Left Arm, Patient Position: Sitting, Cuff Size: Normal)   Pulse 95   Temp 98.1 F (36.7 C) (Oral)   Resp 16   Wt 153 lb (69.4 kg)   SpO2 99%   BMI 26.26 kg/m  Vitals:   09/07/18 1531  BP: (!) 150/90  Pulse: 95  Resp: 16  Temp: 98.1 F (36.7 C)  TempSrc: Oral  SpO2: 99%  Weight: 153 lb (69.4 kg)     Physical Exam  Constitutional: She appears well-developed and well-nourished. No distress.  Neck: Normal range of motion. Neck supple.  Cardiovascular: Normal rate, regular rhythm and normal heart sounds. Exam reveals no gallop and no friction rub.  No murmur heard. Pulmonary/Chest: Effort normal and breath sounds normal. No respiratory distress. She has no wheezes. She has no rales.  Musculoskeletal: She exhibits no edema.  Skin: She is not diaphoretic.  Psychiatric: She has a normal mood and affect. Her behavior is normal. Judgment and thought content normal.  Vitals reviewed.       Assessment & Plan:     1. Encounter for weight loss counseling Doing well. Will continue phentermine at this time since patient is tolerating well and continuing to lose weight. I will see her back in 3 months for her next weight check.  - phentermine (ADIPEX-P) 37.5 MG tablet; TAKE 1 TABLET BY MOUTH DAILY BEFORE  BREAKFAST  Dispense: 30 tablet; Refill: 2  2. BMI 27.0-27.9,adult See above medical treatment plan. - phentermine (ADIPEX-P) 37.5 MG tablet; TAKE 1 TABLET BY MOUTH DAILY BEFORE BREAKFAST  Dispense: 30 tablet; Refill: [place], PA-C  [facility]

## 2018-12-07 ENCOUNTER — Ambulatory Visit: Payer: BC Managed Care – PPO | Admitting: Physician Assistant

## 2018-12-09 ENCOUNTER — Encounter: Payer: Self-pay | Admitting: Physician Assistant

## 2018-12-09 ENCOUNTER — Ambulatory Visit: Payer: BC Managed Care – PPO | Admitting: Physician Assistant

## 2018-12-09 VITALS — BP 130/90 | HR 74 | Temp 98.4°F | Resp 16 | Wt 155.3 lb

## 2018-12-09 DIAGNOSIS — I10 Essential (primary) hypertension: Secondary | ICD-10-CM | POA: Diagnosis not present

## 2018-12-09 MED ORDER — TRIAMTERENE-HCTZ 37.5-25 MG PO TABS: 1.0000 | ORAL_TABLET | Freq: Every day | ORAL | 0 refills | Status: DC

## 2018-12-09 NOTE — Patient Instructions (Signed)
Hydrochlorothiazide, HCTZ; Triamterene tablets or capsules What is this medicine? HYDROCHLOROTHIAZIDE; TRIAMTERENE (hye droe klor oh THYE a zide; trye AM ter een) is a diuretic. It helps you make more urine and lose the extra water from your body. This medicine is used to treat high blood pressure and edema or swelling from excess water. This medicine may be used for other purposes; ask your health care provider or pharmacist if you have questions. COMMON BRAND NAME(S): Dyazide, Maxzide What should I tell my health care provider before I take this medicine? They need to know if you have any of these conditions: -diabetes -immune system problems, like lupus -kidney disease or stones -liver disease -small amount of urine or difficulty passing urine -an unusual or allergic reaction to triamterene, hydrochlorothiazide, sulfa drugs, other medicines, foods, dyes, or preservatives -pregnant or trying to get pregnant -breast-feeding How should I use this medicine? Take this medicine by mouth with a glass of water. Follow the directions on your prescription label. Take your medicine at regular intervals. Do not take it more often than directed. Do not stop taking except on your doctor's advice. Remember that you will need to pass urine frequently after taking this medicine. Do not take your doses at a time of day that will cause you problems. Do not take at bedtime. Talk to your pediatrician regarding the use of this medicine in children. Special care may be needed. Overdosage: If you think you have taken too much of this medicine contact a poison control center or emergency room at once. NOTE: This medicine is only for you. Do not share this medicine with others. What if I miss a dose? If you miss a dose, take it as soon as you can. If it is almost time for your next dose, take only that dose. Do not take double or extra doses. What may interact with this medicine? Do not take this medicine with any  of the following medications: -cidofovir -dofetilide -eplerenone -potassium supplements -tranylcypromine This medicine may also interact with the following medications: -certain medicines for blood pressure, heart disease like benazepril, lisinopril, losartan, valsartan -lithium -medicines for diabetes -medicines that relax muscles for surgery -NSAIDs, medicines for pain and inflammation, like ibuprofen or naproxen -other diuretics -penicillin G potassium This list may not describe all possible interactions. Give your health care provider a list of all the medicines, herbs, non-prescription drugs, or dietary supplements you use. Also tell them if you smoke, drink alcohol, or use illegal drugs. Some items may interact with your medicine. What should I watch for while using this medicine? Visit your doctor or health care professional for regular check ups. You will need lab work done before you start this medicine and regularly while you are taking it. Check your blood pressure regularly. Ask your health care professional what your blood pressure should be, and when you should contact them. If you are a diabetic, check your blood sugar as directed. Do not stop taking your medicine unless your doctor tells you to. You may need to be on a special diet while taking this medicine. Ask your doctor. Also, ask how many glasses of fluid you need to drink a day. You must not get dehydrated. You may get drowsy or dizzy. Do not drive, use machinery, or do anything that needs mental alertness until you know how this medicine affects you. Do not stand or sit up quickly, especially if you are an older patient. This reduces the risk of dizzy or fainting spells. Alcohol   may interfere with the effect of this medicine. Avoid or limit alcoholic drinks. This medicine can make you more sensitive to the sun. Keep out of the sun. If you cannot avoid being in the sun, wear protective clothing and use sunscreen. Do not use  sun lamps or tanning beds/booths. What side effects may I notice from receiving this medicine? Side effects that you should report to your doctor or health care professional as soon as possible: -allergic reactions such as skin rash or itching, hives, swelling of the lips, mouth, tongue, or throat -changes in vision -eye pain -fast or irregular heartbeat, chest pain -feeling faint or dizzy -gout attack -muscle pain or cramps -numbness or tingling in hands, feet, or lips -pain or difficulty when passing urine -redness, blistering, peeling or loosening of the skin, including inside the mouth -shortness of breath -unusually weak or tired Side effects that usually do not require medical attention (report to your doctor or health care professional if they continue or are bothersome): -change in sex drive or performance -dry mouth -headache -stomach upset This list may not describe all possible side effects. Call your doctor for medical advice about side effects. You may report side effects to FDA at 1-800-FDA-1088. Where should I keep my medicine? Keep out of the reach of children. Store at room temperature between 15 and 30 degrees C (59 and 86 degrees F). Protect from light. Throw away any unused medicine after the expiration date. NOTE: This sheet is a summary. It may not cover all possible information. If you have questions about this medicine, talk to your doctor, pharmacist, or health care provider.  2019 Elsevier/Gold Standard (2017-02-27 10:08:07)  

## 2018-12-09 NOTE — Progress Notes (Signed)
Patient: Erika May Female    DOB: 12-04-1965   53 y.o.   MRN: [id number] Visit Date: 12/09/2018  Today's Provider: Mar Daring, PA-C   No chief complaint on file.  Subjective:     HPI  Patient here today with c/o elevated blood pressure. Patient reports that when she was taking the Phentermine it was higher but she stopped the Phentermine early January and reports that her blood pressure is still reading high 160's/90's. Reports that when this happens she feels flush and heart rate increases. Denies edema, headache,chest pain or SOB. Since stopping phentermine BP has decreased but still running 130-140s/80-90s.   Also mother recently diagnosed with breast cancer. She will be 80 this year.   No Known Allergies   Current Outpatient Medications:  .  Calcium Carb-Cholecalciferol (CALCIUM + D3) 600-200 MG-UNIT TABS, Take by mouth 3 (three) times a week. , Disp: , Rfl:  .  calcium carbonate (TUMS - DOSED IN MG ELEMENTAL CALCIUM) 500 MG chewable tablet, Chew 1 tablet by mouth as needed for indigestion or heartburn., Disp: , Rfl:  .  ibuprofen (ADVIL,MOTRIN) 200 MG tablet, Take 200 mg by mouth every 6 (six) hours as needed for headache. , Disp: , Rfl:  .  levothyroxine (SYNTHROID, LEVOTHROID) 50 MCG tablet, TAKE 1 TABLET BY MOUTH ONCE DAILY BEFORE BREAKFAST, Disp: 90 tablet, Rfl: 3 .  loratadine (CLARITIN) 10 MG tablet, Take 10 mg by mouth daily. am, Disp: , Rfl:  .  MICROGESTIN FE 1.5/30 1.5-30 MG-MCG tablet, Take 1 tablet by mouth daily., Disp: 3 Package, Rfl: 3 .  Multiple Vitamin (MULTIVITAMIN) capsule, Take 1 capsule by mouth daily. am, Disp: , Rfl:  .  Sennosides-Docusate Sodium (PERI-COLACE PO), Take by mouth as needed., Disp: , Rfl:  .  phentermine (ADIPEX-P) 37.5 MG tablet, TAKE 1 TABLET BY MOUTH DAILY BEFORE BREAKFAST (Patient not taking: Reported on 12/09/2018), Disp: 30 tablet, Rfl: 2  Review of Systems  Constitutional: Negative.   Eyes: Negative for  visual disturbance.  Respiratory: Negative for chest tightness, shortness of breath and wheezing.   Cardiovascular: Negative for chest pain, palpitations and leg swelling.  Neurological: Negative for dizziness, light-headedness and headaches.    Social History   Tobacco Use  . Smoking status: Never Smoker  . Smokeless tobacco: Never Used  Substance Use Topics  . Alcohol use: Yes    Comment: occasional/1 drink 2x week      Objective:   BP 130/90 (BP Location: Left Arm, Patient Position: Sitting, Cuff Size: Large)   Pulse 74   Temp 98.4 F (36.9 C) (Oral)   Resp 16   Wt 155 lb 4.8 oz (70.4 kg)   SpO2 97%   BMI 26.66 kg/m  Vitals:   12/09/18 0853  BP: 130/90  Pulse: 74  Resp: 16  Temp: 98.4 F (36.9 C)  TempSrc: Oral  SpO2: 97%  Weight: 155 lb 4.8 oz (70.4 kg)     Physical Exam Vitals signs reviewed.  Constitutional:      General: She is not in acute distress.    Appearance: She is well-developed and normal weight. She is not diaphoretic.  Neck:     Musculoskeletal: Normal range of motion and neck supple.  Cardiovascular:     Rate and Rhythm: Normal rate and regular rhythm.     Heart sounds: Normal heart sounds. No murmur. No friction rub. No gallop.   Pulmonary:     Effort: Pulmonary effort is normal.  No respiratory distress.     Breath sounds: Normal breath sounds. No wheezing or rales.  Neurological:     Mental Status: She is alert.        Assessment & Plan    1. Essential hypertension Will start Maxzide as below. I will see her back in 3 months for BP and labs (kidney function and electrolytes).  - triamterene-hydrochlorothiazide (MAXZIDE-25) 37.5-25 MG tablet; Take 1 tablet by mouth daily.  Dispense: 90 tablet; Refill: 0     Mar Daring, PA-C  [facility]

## 2019-01-06 ENCOUNTER — Encounter: Payer: Self-pay | Admitting: Physician Assistant

## 2019-01-06 ENCOUNTER — Other Ambulatory Visit: Payer: Self-pay | Admitting: Physician Assistant

## 2019-01-06 DIAGNOSIS — Z1231 Encounter for screening mammogram for malignant neoplasm of breast: Secondary | ICD-10-CM

## 2019-01-06 DIAGNOSIS — D229 Melanocytic nevi, unspecified: Secondary | ICD-10-CM

## 2019-01-14 ENCOUNTER — Other Ambulatory Visit: Payer: Self-pay | Admitting: Physician Assistant

## 2019-01-14 DIAGNOSIS — Z0000 Encounter for general adult medical examination without abnormal findings: Secondary | ICD-10-CM

## 2019-03-10 ENCOUNTER — Other Ambulatory Visit: Payer: Self-pay | Admitting: Physician Assistant

## 2019-03-10 ENCOUNTER — Encounter: Payer: Self-pay | Admitting: Physician Assistant

## 2019-03-10 ENCOUNTER — Other Ambulatory Visit: Payer: Self-pay

## 2019-03-10 ENCOUNTER — Ambulatory Visit: Payer: BC Managed Care – PPO | Admitting: Physician Assistant

## 2019-03-10 VITALS — BP 140/95 | HR 76 | Temp 98.5°F | Resp 16 | Wt 155.6 lb

## 2019-03-10 DIAGNOSIS — I10 Essential (primary) hypertension: Secondary | ICD-10-CM

## 2019-03-10 NOTE — Patient Instructions (Signed)
DASH Eating Plan  DASH stands for "Dietary Approaches to Stop Hypertension." The DASH eating plan is a healthy eating plan that has been shown to reduce high blood pressure (hypertension). It may also reduce your risk for type 2 diabetes, heart disease, and stroke. The DASH eating plan may also help with weight loss.  What are tips for following this plan?    General guidelines   Avoid eating more than 2,300 mg (milligrams) of salt (sodium) a day. If you have hypertension, you may need to reduce your sodium intake to 1,500 mg a day.   Limit alcohol intake to no more than 1 drink a day for nonpregnant women and 2 drinks a day for men. One drink equals 12 oz of beer, 5 oz of wine, or 1 oz of hard liquor.   Work with your health care provider to maintain a healthy body weight or to lose weight. Ask what an ideal weight is for you.   Get at least 30 minutes of exercise that causes your heart to beat faster (aerobic exercise) most days of the week. Activities may include walking, swimming, or biking.   Work with your health care provider or diet and nutrition specialist (dietitian) to adjust your eating plan to your individual calorie needs.  Reading food labels     Check food labels for the amount of sodium per serving. Choose foods with less than 5 percent of the Daily Value of sodium. Generally, foods with less than 300 mg of sodium per serving fit into this eating plan.   To find whole grains, look for the word "whole" as the first word in the ingredient list.  Shopping   Buy products labeled as "low-sodium" or "no salt added."   Buy fresh foods. Avoid canned foods and premade or frozen meals.  Cooking   Avoid adding salt when cooking. Use salt-free seasonings or herbs instead of table salt or sea salt. Check with your health care provider or pharmacist before using salt substitutes.   Do not fry foods. Cook foods using healthy methods such as baking, boiling, grilling, and broiling instead.   Cook with  heart-healthy oils, such as olive, canola, soybean, or sunflower oil.  Meal planning   Eat a balanced diet that includes:  ? 5 or more servings of fruits and vegetables each day. At each meal, try to fill half of your plate with fruits and vegetables.  ? Up to 6-8 servings of whole grains each day.  ? Less than 6 oz of lean meat, poultry, or fish each day. A 3-oz serving of meat is about the same size as a deck of cards. One egg equals 1 oz.  ? 2 servings of low-fat dairy each day.  ? A serving of nuts, seeds, or beans 5 times each week.  ? Heart-healthy fats. Healthy fats called Omega-3 fatty acids are found in foods such as flaxseeds and coldwater fish, like sardines, salmon, and mackerel.   Limit how much you eat of the following:  ? Canned or prepackaged foods.  ? Food that is high in trans fat, such as fried foods.  ? Food that is high in saturated fat, such as fatty meat.  ? Sweets, desserts, sugary drinks, and other foods with added sugar.  ? Full-fat dairy products.   Do not salt foods before eating.   Try to eat at least 2 vegetarian meals each week.   Eat more home-cooked food and less restaurant, buffet, and fast food.     When eating at a restaurant, ask that your food be prepared with less salt or no salt, if possible.  What foods are recommended?  The items listed may not be a complete list. Talk with your dietitian about what dietary choices are best for you.  Grains  Whole-grain or whole-wheat bread. Whole-grain or whole-wheat pasta. Brown rice. Oatmeal. Quinoa. Bulgur. Whole-grain and low-sodium cereals. Pita bread. Low-fat, low-sodium crackers. Whole-wheat flour tortillas.  Vegetables  Fresh or frozen vegetables (raw, steamed, roasted, or grilled). Low-sodium or reduced-sodium tomato and vegetable juice. Low-sodium or reduced-sodium tomato sauce and tomato paste. Low-sodium or reduced-sodium canned vegetables.  Fruits  All fresh, dried, or frozen fruit. Canned fruit in natural juice (without  added sugar).  Meat and other protein foods  Skinless chicken or turkey. Ground chicken or turkey. Pork with fat trimmed off. Fish and seafood. Egg whites. Dried beans, peas, or lentils. Unsalted nuts, nut butters, and seeds. Unsalted canned beans. Lean cuts of beef with fat trimmed off. Low-sodium, lean deli meat.  Dairy  Low-fat (1%) or fat-free (skim) milk. Fat-free, low-fat, or reduced-fat cheeses. Nonfat, low-sodium ricotta or cottage cheese. Low-fat or nonfat yogurt. Low-fat, low-sodium cheese.  Fats and oils  Soft margarine without trans fats. Vegetable oil. Low-fat, reduced-fat, or light mayonnaise and salad dressings (reduced-sodium). Canola, safflower, olive, soybean, and sunflower oils. Avocado.  Seasoning and other foods  Herbs. Spices. Seasoning mixes without salt. Unsalted popcorn and pretzels. Fat-free sweets.  What foods are not recommended?  The items listed may not be a complete list. Talk with your dietitian about what dietary choices are best for you.  Grains  Baked goods made with fat, such as croissants, muffins, or some breads. Dry pasta or rice meal packs.  Vegetables  Creamed or fried vegetables. Vegetables in a cheese sauce. Regular canned vegetables (not low-sodium or reduced-sodium). Regular canned tomato sauce and paste (not low-sodium or reduced-sodium). Regular tomato and vegetable juice (not low-sodium or reduced-sodium). Pickles. Olives.  Fruits  Canned fruit in a light or heavy syrup. Fried fruit. Fruit in cream or butter sauce.  Meat and other protein foods  Fatty cuts of meat. Ribs. Fried meat. Bacon. Sausage. Bologna and other processed lunch meats. Salami. Fatback. Hotdogs. Bratwurst. Salted nuts and seeds. Canned beans with added salt. Canned or smoked fish. Whole eggs or egg yolks. Chicken or turkey with skin.  Dairy  Whole or 2% milk, cream, and half-and-half. Whole or full-fat cream cheese. Whole-fat or sweetened yogurt. Full-fat cheese. Nondairy creamers. Whipped toppings.  Processed cheese and cheese spreads.  Fats and oils  Butter. Stick margarine. Lard. Shortening. Ghee. Bacon fat. Tropical oils, such as coconut, palm kernel, or palm oil.  Seasoning and other foods  Salted popcorn and pretzels. Onion salt, garlic salt, seasoned salt, table salt, and sea salt. Worcestershire sauce. Tartar sauce. Barbecue sauce. Teriyaki sauce. Soy sauce, including reduced-sodium. Steak sauce. Canned and packaged gravies. Fish sauce. Oyster sauce. Cocktail sauce. Horseradish that you find on the shelf. Ketchup. Mustard. Meat flavorings and tenderizers. Bouillon cubes. Hot sauce and Tabasco sauce. Premade or packaged marinades. Premade or packaged taco seasonings. Relishes. Regular salad dressings.  Where to find more information:   National Heart, Lung, and Blood Institute: www.nhlbi.nih.gov   American Heart Association: www.heart.org  Summary   The DASH eating plan is a healthy eating plan that has been shown to reduce high blood pressure (hypertension). It may also reduce your risk for type 2 diabetes, heart disease, and stroke.   With the   DASH eating plan, you should limit salt (sodium) intake to 2,300 mg a day. If you have hypertension, you may need to reduce your sodium intake to 1,500 mg a day.   When on the DASH eating plan, aim to eat more fresh fruits and vegetables, whole grains, lean proteins, low-fat dairy, and heart-healthy fats.   Work with your health care provider or diet and nutrition specialist (dietitian) to adjust your eating plan to your individual calorie needs.  This information is not intended to replace advice given to you by your health care provider. Make sure you discuss any questions you have with your health care provider.  Document Released: 10/03/2011 Document Revised: 10/07/2016 Document Reviewed: 10/07/2016  Elsevier Interactive Patient Education  2019 [employer] Inc[employer]

## 2019-03-10 NOTE — Progress Notes (Signed)
Patient: Erika May Female    DOB: 1966-02-29   53 y.o.   MRN: [id number] Visit Date: 03/10/2019  Today's Provider: Mar Daring, PA-C   Chief Complaint  Patient presents with  . Follow-up    HTN and labs   Subjective:     HPI   Hypertension, follow-up:  BP Readings from Last 3 Encounters:  03/10/19 (!) 140/95  12/09/18 130/90  09/07/18 (!) 150/90    She was last seen for hypertension 3 months ago.  BP at that visit was 130/90. Management since that visit includes start Maxzide. She reports excellent compliance with treatment. She is not having side effects.  She is exercising some. She is adherent to low salt diet.   Outside blood pressures are 130's/80's. She is experiencing none.  Patient denies chest pain, chest pressure/discomfort, exertional chest pressure/discomfort, fatigue, irregular heart beat, lower extremity edema, near-syncope and palpitations.   Cardiovascular risk factors include dyslipidemia and hypertension.   Weight trend: stable Wt Readings from Last 3 Encounters:  03/10/19 155 lb 9.6 oz (70.6 kg)  12/09/18 155 lb 4.8 oz (70.4 kg)  09/07/18 153 lb (69.4 kg)    Current diet: well balanced ------------------------------------------------------------------------   No Known Allergies   Current Outpatient Medications:  .  Calcium Carb-Cholecalciferol (CALCIUM + D3) 600-200 MG-UNIT TABS, Take by mouth 3 (three) times a week. , Disp: , Rfl:  .  calcium carbonate (TUMS - DOSED IN MG ELEMENTAL CALCIUM) 500 MG chewable tablet, Chew 1 tablet by mouth as needed for indigestion or heartburn., Disp: , Rfl:  .  ibuprofen (ADVIL,MOTRIN) 200 MG tablet, Take 200 mg by mouth every 6 (six) hours as needed for headache. , Disp: , Rfl:  .  levothyroxine (SYNTHROID, LEVOTHROID) 50 MCG tablet, TAKE 1 TABLET BY MOUTH ONCE DAILY BEFORE BREAKFAST, Disp: 90 tablet, Rfl: 3 .  loratadine (CLARITIN) 10 MG tablet, Take 10 mg by mouth daily. am,  Disp: , Rfl:  .  MICROGESTIN FE 1.5/30 1.5-30 MG-MCG tablet, Take 1 tablet by mouth once daily, Disp: 3 Package, Rfl: 4 .  Multiple Vitamin (MULTIVITAMIN) capsule, Take 1 capsule by mouth daily. am, Disp: , Rfl:  .  Sennosides-Docusate Sodium (PERI-COLACE PO), Take by mouth as needed., Disp: , Rfl:  .  triamterene-hydrochlorothiazide (MAXZIDE-25) 37.5-25 MG tablet, Take 1 tablet by mouth daily., Disp: 90 tablet, Rfl: 0  Review of Systems  Constitutional: Negative.   Respiratory: Negative.   Cardiovascular: Negative.   Neurological: Negative.     Social History   Tobacco Use  . Smoking status: Never Smoker  . Smokeless tobacco: Never Used  Substance Use Topics  . Alcohol use: Yes    Comment: occasional/1 drink 2x week      Objective:   BP (!) 140/95 (BP Location: Left Arm, Patient Position: Sitting, Cuff Size: Large)   Pulse 76   Temp 98.5 F (36.9 C) (Oral)   Resp 16   Wt 155 lb 9.6 oz (70.6 kg)   BMI 26.71 kg/m  Vitals:   03/10/19 1400  BP: (!) 140/95  Pulse: 76  Resp: 16  Temp: 98.5 F (36.9 C)  TempSrc: Oral  Weight: 155 lb 9.6 oz (70.6 kg)     Physical Exam Vitals signs reviewed.  Constitutional:      General: She is not in acute distress.    Appearance: Normal appearance. She is well-developed. She is not ill-appearing or diaphoretic.  Neck:     Musculoskeletal: Normal range  of motion and neck supple.     Thyroid: No thyromegaly.     Vascular: No JVD.     Trachea: No tracheal deviation.  Cardiovascular:     Rate and Rhythm: Normal rate and regular rhythm.     Heart sounds: Normal heart sounds. No murmur. No friction rub. No gallop.   Pulmonary:     Effort: Pulmonary effort is normal. No respiratory distress.     Breath sounds: Normal breath sounds. No wheezing or rales.  Lymphadenopathy:     Cervical: No cervical adenopathy.  Neurological:     Mental Status: She is alert.        Assessment & Plan    1. Essential hypertension Will check labs  as below and f/u pending results. Continue Maxzide 37.5-25mg . Advised to monitor BP at home and call if consistently running over 140/90.  - Basic Metabolic Panel (BMET)     Mar Daring, PA-C  [place]

## 2019-03-11 ENCOUNTER — Telehealth: Payer: Self-pay

## 2019-03-11 LAB — BASIC METABOLIC PANEL
BUN/Creatinine Ratio: 11 (ref 9–23)
BUN: 12 mg/dL (ref 6–24)
CO2: 23 mmol/L (ref 20–29)
Calcium: 9.4 mg/dL (ref 8.7–10.2)
Chloride: 100 mmol/L (ref 96–106)
Creatinine, Ser: 1.11 mg/dL — ABNORMAL HIGH (ref 0.57–1.00)
GFR calc Af Amer: 65 mL/min/{1.73_m2} (ref >59)
GFR calc non Af Amer: 56 mL/min/{1.73_m2} — ABNORMAL LOW (ref >59)
Glucose: 86 mg/dL (ref 65–99)
Potassium: 3.3 mmol/L — ABNORMAL LOW (ref 3.5–5.2)
Sodium: 139 mmol/L (ref 134–144)

## 2019-03-11 NOTE — Telephone Encounter (Signed)
Viewed by Ralph Leyden on 03/11/2019 1:12 PM

## 2019-03-11 NOTE — Telephone Encounter (Signed)
-----   Message from Mar Daring, [place] sent at 03/11/2019 12:12 PM EDT ----- So the medication actually has lowered your potassium some. It is 3.3 and low normal is 3.5. This can be increased by adding potassium rich foods in to your diet such as sweet potatoes, broccoli, leafy greens, bananas, and nuts. You can also google potassium rich foods to see what other foods are high in potassium.

## 2019-04-25 ENCOUNTER — Other Ambulatory Visit: Payer: Self-pay | Admitting: Physician Assistant

## 2019-04-25 DIAGNOSIS — E039 Hypothyroidism, unspecified: Secondary | ICD-10-CM

## 2019-06-10 NOTE — Progress Notes (Signed)
Patient: Erika May, Female    DOB: 1966-03-03, 53 y.o.   MRN: [id number] Visit Date: 06/11/2019  Today's Provider: Mar Daring, PA-C   Chief Complaint  Patient presents with  . Annual Exam   Subjective:     Annual physical exam Arbadella Julian Hy is a 53 y.o. female who presents today for health maintenance and complete physical. She feels well. She reports exercising. She reports she is sleeping well. ----------------------------------------------------------------- Mammogram scheduled for 07/19/2019 HUD:[id number] and HPV-Neg 04/06/2018  Review of Systems  Constitutional: Negative.   HENT: Positive for postnasal drip.   Eyes: Positive for itching.  Respiratory: Negative.   Cardiovascular: Negative.   Gastrointestinal: Negative.   Endocrine: Negative.   Genitourinary: Negative.   Musculoskeletal: Positive for neck stiffness.  Skin: Negative.   Allergic/Immunologic: Positive for environmental allergies.  Neurological: Negative.   Hematological: Negative.   Psychiatric/Behavioral: Positive for decreased concentration ("at work").    Social History      She  reports that she has never smoked. She has never used smokeless tobacco. She reports current alcohol use. She reports that she does not use drugs.       Social History   Socioeconomic History  . Marital status: Married    Spouse name: Not on file  . Number of children: Not on file  . Years of education: Not on file  . Highest education level: Not on file  Occupational History  . Not on file  Social Needs  . Financial resource strain: Not on file  . Food insecurity    Worry: Not on file    Inability: Not on file  . Transportation needs    Medical: Not on file    Non-medical: Not on file  Tobacco Use  . Smoking status: Never Smoker  . Smokeless tobacco: Never Used  Substance and Sexual Activity  . Alcohol use: Yes    Comment: occasional/1 drink 2x week  . Drug use: No  .  Sexual activity: Yes    Birth control/protection: Pill  Lifestyle  . Physical activity    Days per week: Not on file    Minutes per session: Not on file  . Stress: Not on file  Relationships  . Social Herbalist on phone: Not on file    Gets together: Not on file    Attends religious service: Not on file    Active member of club or organization: Not on file    Attends meetings of clubs or organizations: Not on file    Relationship status: Not on file  Other Topics Concern  . Not on file  Social History Narrative  . Not on file    Past Medical History:  Diagnosis Date  . 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 DDD (degenerative disc disease), lumbar   . GERD (gastroesophageal reflux disease)    heartburn occasional  . Headache    1/week  . Hypothyroidism   . Incontinence   . Joint stiffness   . Rhinitis, allergic      Patient Active Problem List   Diagnosis Date Noted  . BMI 26.0-26.9,adult 04/03/2017  . HLD (hyperlipidemia) 03/13/2016  . Family planning 03/13/2016  . Benign neoplasm of skin 03/13/2016  . Blood pressure elevated 03/13/2016  . Symptomatic menopausal or female climacteric states 03/13/2016  . Borderline diabetes 03/13/2016  .  Contact dermatitis due to Genus Toxicodendron 03/13/2016  . Avitaminosis D 03/13/2016  . Adult hypothyroidism 10/09/2015  . First degree hemorrhoids   . Fibroid 04/15/2008  . Allergic rhinitis 08/10/2007  . Narrowing of intervertebral disc space 08/10/2007    Past Surgical History:  Procedure Laterality Date  . AUGMENTATION MAMMAPLASTY Bilateral 0947   silicone  . BREAST CYST EXCISION Bilateral 1990's   benign  . CERVICAL BIOPSY  W/ LOOP ELECTRODE EXCISION  10/2009  . COLONOSCOPY N/A 05/08/2015   Procedure: COLONOSCOPY;  Surgeon: Lucilla Lame, MD;  Location: [facility];  Service: Gastroenterology;  Laterality: N/A;  . WISDOM TOOTH  EXTRACTION      Family History        Family Status  Relation Name Status  . Mother  Alive  . Father  Deceased at age 64  . Sister  Alive  . Mat Aunt  (Not Specified)        Her family history includes Arthritis in her mother; Breast cancer in her maternal aunt; Cancer in her mother; Cataracts in her father; Emphysema in her father; Healthy in her sister; Hyperlipidemia in her father and mother; Hypertension in her mother; Hypothyroidism in her mother; Melanoma in her father; Transient ischemic attack in her mother.      No Known Allergies   Current Outpatient Medications:  .  Calcium Carb-Cholecalciferol (CALCIUM + D3) 600-200 MG-UNIT TABS, Take by mouth 3 (three) times a week. , Disp: , Rfl:  .  calcium carbonate (TUMS - DOSED IN MG ELEMENTAL CALCIUM) 500 MG chewable tablet, Chew 1 tablet by mouth as needed for indigestion or heartburn., Disp: , Rfl:  .  ibuprofen (ADVIL,MOTRIN) 200 MG tablet, Take 200 mg by mouth every 6 (six) hours as needed for headache. , Disp: , Rfl:  .  levothyroxine (SYNTHROID) 50 MCG tablet, TAKE 1 TABLET BY MOUTH ONCE DAILY BEFORE BREAKFAST, Disp: 90 tablet, Rfl: 1 .  loratadine (CLARITIN) 10 MG tablet, Take 10 mg by mouth daily. am, Disp: , Rfl:  .  MICROGESTIN FE 1.5/30 1.5-30 MG-MCG tablet, Take 1 tablet by mouth once daily, Disp: 3 Package, Rfl: 4 .  Multiple Vitamin (MULTIVITAMIN) capsule, Take 1 capsule by mouth daily. am, Disp: , Rfl:  .  Sennosides-Docusate Sodium (PERI-COLACE PO), Take by mouth as needed., Disp: , Rfl:  .  triamterene-hydrochlorothiazide (MAXZIDE-25) 37.5-25 MG tablet, Take 1 tablet by mouth once daily, Disp: 90 tablet, Rfl: 1   Patient Care Team: Mar Daring, PA-C as PCP - General (Family Medicine)    Objective:    Vitals: BP (!) 156/90 (BP Location: Left Arm, Patient Position: Sitting, Cuff Size: Normal)   Pulse 92   Temp (!) 97.5 F (36.4 C) (Other (Comment)) Comment (Src): forehead  Resp 16   Ht 5' 4.5" (1.638  m)   Wt 152 lb 12.8 oz (69.3 kg)   BMI 25.82 kg/m    Vitals:   06/11/19 1417  BP: (!) 156/90  Pulse: 92  Resp: 16  Temp: (!) 97.5 F (36.4 C)  TempSrc: Other (Comment)  Weight: 152 lb 12.8 oz (69.3 kg)  Height: 5' 4.5" (1.638 m)     Physical Exam Vitals signs reviewed.  Constitutional:      General: She is not in acute distress.    Appearance: Normal appearance. She is well-developed and normal weight. She is not ill-appearing or diaphoretic.  HENT:     Head: Normocephalic and atraumatic.     Right Ear: Tympanic membrane, ear  canal and external ear normal.     Left Ear: Tympanic membrane, ear canal and external ear normal.     Nose: Nose normal.     Mouth/Throat:     Mouth: Mucous membranes are moist.     Pharynx: Oropharynx is clear. No oropharyngeal exudate.  Eyes:     General: No scleral icterus.       Right eye: No discharge.        Left eye: No discharge.     Extraocular Movements: Extraocular movements intact.     Conjunctiva/sclera: Conjunctivae normal.     Pupils: Pupils are equal, round, and reactive to light.  Neck:     Musculoskeletal: Normal range of motion and neck supple.     Thyroid: No thyromegaly.     Vascular: No JVD.     Trachea: No tracheal deviation.  Cardiovascular:     Rate and Rhythm: Normal rate and regular rhythm.     Pulses: Normal pulses.     Heart sounds: Normal heart sounds. No murmur. No friction rub. No gallop.   Pulmonary:     Effort: Pulmonary effort is normal. No respiratory distress.     Breath sounds: Normal breath sounds. No wheezing or rales.  Chest:     Chest wall: No tenderness.     Breasts:        Right: Normal.        Left: Normal.  Abdominal:     General: Abdomen is flat. Bowel sounds are normal. There is no distension.     Palpations: Abdomen is soft. There is no mass.     Tenderness: There is no abdominal tenderness. There is no guarding or rebound.  Musculoskeletal: Normal range of motion.        General: No  tenderness.     Right lower leg: No edema.     Left lower leg: No edema.  Lymphadenopathy:     Cervical: No cervical adenopathy.  Skin:    General: Skin is warm and dry.     Capillary Refill: Capillary refill takes less than 2 seconds.     Findings: No rash.  Neurological:     General: No focal deficit present.     Mental Status: She is alert and oriented to person, place, and time. Mental status is at baseline.  Psychiatric:        Mood and Affect: Mood normal.        Behavior: Behavior normal.        Thought Content: Thought content normal.        Judgment: Judgment normal.      Depression Screen PHQ 2/9 Scores 06/11/2019 04/06/2018 04/03/2017 04/02/2016  PHQ - 2 Score 0 0 0 0  PHQ- 9 Score - 0 4 -       Assessment & Plan:     Routine Health Maintenance and Physical Exam  Exercise Activities and Dietary recommendations Goals   None     Immunization History  Administered Date(s) Administered  . Influenza-Unspecified 07/25/2017, 08/15/2018  . Tdap 04/02/2016    Health Maintenance  Topic Date Due  . INFLUENZA VACCINE  05/29/2019  . MAMMOGRAM  11/19/2019  . PAP SMEAR-Modifier  04/06/2021  . COLONOSCOPY  05/07/2025  . TETANUS/TDAP  04/02/2026  . Hepatitis C Screening  Completed  . HIV Screening  Completed     Discussed health benefits of physical activity, and encouraged her to engage in regular exercise appropriate for her age and condition.  1. Annual physical exam Normal physical exam today. Will check labs as below and f/u pending lab results. If labs are stable and WNL she will not need to have these rechecked for one year at her next annual physical exam. She is to call the office in the meantime if she has any acute issue, questions or concerns. - CBC with Differential/Platelet - Comprehensive metabolic panel - Hemoglobin A1c - Lipid panel - TSH  2. Breast cancer screening Breast exam today was normal. There is  family history of breast cancer in  her mother. She does perform regular self breast exams. Mammogram was ordered as below. Information for [facility] was given to patient so she may schedule her mammogram at her convenience. - CBC with Differential/Platelet - Comprehensive metabolic panel - Hemoglobin A1c - Lipid panel - TSH  3. Adult hypothyroidism Stable. Continue levothyroxine 69mcg. Will check labs as below and f/u pending results. - CBC with Differential/Platelet - Comprehensive metabolic panel - Hemoglobin A1c - Lipid panel - TSH  4. Pure hypercholesterolemia Diet controlled. Will check labs as below and f/u pending results. - CBC with Differential/Platelet - Comprehensive metabolic panel - Hemoglobin A1c - Lipid panel - TSH  5. Borderline diabetes Diet controlled. Will check labs as below and f/u pending results. - CBC with Differential/Platelet - Comprehensive metabolic panel - Hemoglobin A1c - Lipid panel - TSH  6. Family history of breast cancer in mother Will check labs as below and f/u pending results. - BRCAssure Comprehensive Panel  --------------------------------------------------------------------    Mar Daring, PA-C  [facility]

## 2019-06-11 ENCOUNTER — Encounter: Payer: Self-pay | Admitting: Physician Assistant

## 2019-06-11 ENCOUNTER — Ambulatory Visit (INDEPENDENT_AMBULATORY_CARE_PROVIDER_SITE_OTHER): Payer: BC Managed Care – PPO | Admitting: Physician Assistant

## 2019-06-11 ENCOUNTER — Other Ambulatory Visit: Payer: Self-pay

## 2019-06-11 VITALS — BP 156/90 | HR 92 | Temp 97.5°F | Resp 16 | Ht 64.5 in | Wt 152.8 lb

## 2019-06-11 DIAGNOSIS — Z803 Family history of malignant neoplasm of breast: Secondary | ICD-10-CM

## 2019-06-11 DIAGNOSIS — Z1239 Encounter for other screening for malignant neoplasm of breast: Secondary | ICD-10-CM | POA: Diagnosis not present

## 2019-06-11 DIAGNOSIS — Z0000 Encounter for general adult medical examination without abnormal findings: Secondary | ICD-10-CM

## 2019-06-11 DIAGNOSIS — E7800 Pure hypercholesterolemia, unspecified: Secondary | ICD-10-CM | POA: Diagnosis not present

## 2019-06-11 DIAGNOSIS — E039 Hypothyroidism, unspecified: Secondary | ICD-10-CM

## 2019-06-11 DIAGNOSIS — R7303 Prediabetes: Secondary | ICD-10-CM

## 2019-06-11 NOTE — Patient Instructions (Signed)
Health Maintenance, Female Adopting a healthy lifestyle and getting preventive care are important in promoting health and wellness. Ask your health care provider about:  The right schedule for you to have regular tests and exams.  Things you can do on your own to prevent diseases and keep yourself healthy. What should I know about diet, weight, and exercise? Eat a healthy diet   Eat a diet that includes plenty of vegetables, fruits, low-fat dairy products, and lean protein.  Do not eat a lot of foods that are high in solid fats, added sugars, or sodium. Maintain a healthy weight Body mass index (BMI) is used to identify weight problems. It estimates body fat based on height and weight. Your health care provider can help determine your BMI and help you achieve or maintain a healthy weight. Get regular exercise Get regular exercise. This is one of the most important things you can do for your health. Most adults should:  Exercise for at least 150 minutes each week. The exercise should increase your heart rate and make you sweat (moderate-intensity exercise).  Do strengthening exercises at least twice a week. This is in addition to the moderate-intensity exercise.  Spend less time sitting. Even light physical activity can be beneficial. Watch cholesterol and blood lipids Have your blood tested for lipids and cholesterol at 55 years of age, then have this test every 5 years. Have your cholesterol levels checked more often if:  Your lipid or cholesterol levels are high.  You are older than 55 years of age.  You are at high risk for heart disease. What should I know about cancer screening? Depending on your health history and family history, you may need to have cancer screening at various ages. This may include screening for:  Breast cancer.  Cervical cancer.  Colorectal cancer.  Skin cancer.  Lung cancer. What should I know about heart disease, diabetes, and high blood  pressure? Blood pressure and heart disease  High blood pressure causes heart disease and increases the risk of stroke. This is more likely to develop in people who have high blood pressure readings, are of African descent, or are overweight.  Have your blood pressure checked: ? Every 3-5 years if you are 18-39 years of age. ? Every year if you are 40 years old or older. Diabetes Have regular diabetes screenings. This checks your fasting blood sugar level. Have the screening done:  Once every three years after age 40 if you are at a normal weight and have a low risk for diabetes.  More often and at a younger age if you are overweight or have a high risk for diabetes. What should I know about preventing infection? Hepatitis B If you have a higher risk for hepatitis B, you should be screened for this virus. Talk with your health care provider to find out if you are at risk for hepatitis B infection. Hepatitis C Testing is recommended for:  Everyone born from 1945 through 1965.  Anyone with known risk factors for hepatitis C. Sexually transmitted infections (STIs)  Get screened for STIs, including gonorrhea and chlamydia, if: ? You are sexually active and are younger than 55 years of age. ? You are older than 55 years of age and your health care provider tells you that you are at risk for this type of infection. ? Your sexual activity has changed since you were last screened, and you are at increased risk for chlamydia or gonorrhea. Ask your health care provider if   you are at risk.  Ask your health care provider about whether you are at high risk for HIV. Your health care provider may recommend a prescription medicine to help prevent HIV infection. If you choose to take medicine to prevent HIV, you should first get tested for HIV. You should then be tested every 3 months for as long as you are taking the medicine. Pregnancy  If you are about to stop having your period (premenopausal) and  you may become pregnant, seek counseling before you get pregnant.  Take 400 to 800 micrograms (mcg) of folic acid every day if you become pregnant.  Ask for birth control (contraception) if you want to prevent pregnancy. Osteoporosis and menopause Osteoporosis is a disease in which the bones lose minerals and strength with aging. This can result in bone fractures. If you are 65 years old or older, or if you are at risk for osteoporosis and fractures, ask your health care provider if you should:  Be screened for bone loss.  Take a calcium or vitamin D supplement to lower your risk of fractures.  Be given hormone replacement therapy (HRT) to treat symptoms of menopause. Follow these instructions at home: Lifestyle  Do not use any products that contain nicotine or tobacco, such as cigarettes, e-cigarettes, and chewing tobacco. If you need help quitting, ask your health care provider.  Do not use street drugs.  Do not share needles.  Ask your health care provider for help if you need support or information about quitting drugs. Alcohol use  Do not drink alcohol if: ? Your health care provider tells you not to drink. ? You are pregnant, may be pregnant, or are planning to become pregnant.  If you drink alcohol: ? Limit how much you use to 0-1 drink a day. ? Limit intake if you are breastfeeding.  Be aware of how much alcohol is in your drink. In the [place] one drink equals one 12 oz bottle of beer (355 mL), one 5 oz glass of wine (148 mL), or one 1 oz glass of hard liquor (44 mL). General instructions  Schedule regular health, dental, and eye exams.  Stay current with your vaccines.  Tell your health care provider if: ? You often feel depressed. ? You have ever been abused or do not feel safe at home. Summary  Adopting a healthy lifestyle and getting preventive care are important in promoting health and wellness.  Follow your health care provider's instructions about healthy  diet, exercising, and getting tested or screened for diseases.  Follow your health care provider's instructions on monitoring your cholesterol and blood pressure. This information is not intended to replace advice given to you by your health care provider. Make sure you discuss any questions you have with your health care provider. Document Released: 04/29/2011 Document Revised: 10/07/2018 Document Reviewed: 10/07/2018 Elsevier Patient Education  2020 Elsevier Inc.  

## 2019-06-15 ENCOUNTER — Telehealth: Payer: Self-pay

## 2019-06-15 DIAGNOSIS — Z803 Family history of malignant neoplasm of breast: Secondary | ICD-10-CM

## 2019-06-15 LAB — COMPREHENSIVE METABOLIC PANEL
ALT: 11 IU/L (ref 0–32)
AST: 25 IU/L (ref 0–40)
Albumin/Globulin Ratio: 2.4 — ABNORMAL HIGH (ref 1.2–2.2)
Albumin: 4.5 g/dL (ref 3.8–4.9)
Alkaline Phosphatase: 59 IU/L (ref 39–117)
BUN/Creatinine Ratio: 7 — ABNORMAL LOW (ref 9–23)
BUN: 8 mg/dL (ref 6–24)
Bilirubin Total: 0.4 mg/dL (ref 0.0–1.2)
CO2: 24 mmol/L (ref 20–29)
Calcium: 9.6 mg/dL (ref 8.7–10.2)
Chloride: 92 mmol/L — ABNORMAL LOW (ref 96–106)
Creatinine, Ser: 1.19 mg/dL — ABNORMAL HIGH (ref 0.57–1.00)
GFR calc Af Amer: 60 mL/min/{1.73_m2} (ref >59)
GFR calc non Af Amer: 52 mL/min/{1.73_m2} — ABNORMAL LOW (ref >59)
Globulin, Total: 1.9 g/dL (ref 1.5–4.5)
Glucose: 85 mg/dL (ref 65–99)
Potassium: 3.3 mmol/L — ABNORMAL LOW (ref 3.5–5.2)
Sodium: 135 mmol/L (ref 134–144)
Total Protein: 6.4 g/dL (ref 6.0–8.5)

## 2019-06-15 LAB — CBC WITH DIFFERENTIAL/PLATELET
Basophils Absolute: 0 x10E3/uL (ref 0.0–0.2)
Basos: 1 %
EOS (ABSOLUTE): 0.1 x10E3/uL (ref 0.0–0.4)
Eos: 2 %
Hematocrit: 39.1 % (ref 34.0–46.6)
Hemoglobin: 13.9 g/dL (ref 11.1–15.9)
Immature Grans (Abs): 0 x10E3/uL (ref 0.0–0.1)
Immature Granulocytes: 0 %
Lymphocytes Absolute: 1.2 x10E3/uL (ref 0.7–3.1)
Lymphs: 26 %
MCH: 31.3 pg (ref 26.6–33.0)
MCHC: 35.5 g/dL (ref 31.5–35.7)
MCV: 88 fL (ref 79–97)
Monocytes Absolute: 0.4 x10E3/uL (ref 0.1–0.9)
Monocytes: 8 %
Neutrophils Absolute: 3 x10E3/uL (ref 1.4–7.0)
Neutrophils: 63 %
Platelets: 280 x10E3/uL (ref 150–450)
RBC: 4.44 x10E6/uL (ref 3.77–5.28)
RDW: 11.8 % (ref 11.7–15.4)
WBC: 4.7 x10E3/uL (ref 3.4–10.8)

## 2019-06-15 LAB — HEMOGLOBIN A1C
Est. average glucose Bld gHb Est-mCnc: 103 mg/dL
Hgb A1c MFr Bld: 5.2 % (ref 4.8–5.6)

## 2019-06-15 LAB — LIPID PANEL
Chol/HDL Ratio: 2.8 ratio (ref 0.0–4.4)
Cholesterol, Total: 188 mg/dL (ref 100–199)
HDL: 66 mg/dL
LDL Calculated: 100 mg/dL — ABNORMAL HIGH (ref 0–99)
Triglycerides: 110 mg/dL (ref 0–149)
VLDL Cholesterol Cal: 22 mg/dL (ref 5–40)

## 2019-06-15 LAB — TSH: TSH: 3.35 u[IU]/mL (ref 0.450–4.500)

## 2019-06-15 NOTE — Telephone Encounter (Signed)
Referral placed.

## 2019-06-15 NOTE — Telephone Encounter (Signed)
[facility] called reporting that patient's insurance [employer] requires genetic counseling before they will cover for BRCA testing. [facility] reports that they have contacted patient and she agreed to counseling first. sd

## 2019-06-15 NOTE — Telephone Encounter (Signed)
-----   Message from Mar Daring, PA-C sent at 06/15/2019  3:47 PM EDT ----- Blood count is normal. Kidney function stable. Continue to stay well hydrated. Also can be increased if taking pre/post work out supplements. Potassium still borderline low at 3.3 (low normal is 3.5). Can increase potassium rich foods such as bananas, sweet potatoes, broccoli, leafy greens. Liver function is normal. A1c/sugar is normal. Cholesterol normal. Thyroid is normal.

## 2019-06-15 NOTE — Telephone Encounter (Signed)
Patient advised.

## 2019-07-01 ENCOUNTER — Encounter: Payer: Self-pay | Admitting: Physician Assistant

## 2019-07-12 ENCOUNTER — Ambulatory Visit: Payer: BC Managed Care – PPO | Admitting: Physician Assistant

## 2019-07-12 ENCOUNTER — Encounter: Payer: Self-pay | Admitting: Physician Assistant

## 2019-07-12 ENCOUNTER — Other Ambulatory Visit: Payer: Self-pay

## 2019-07-12 VITALS — BP 156/93 | HR 92 | Temp 98.0°F | Resp 16 | Wt 150.8 lb

## 2019-07-12 DIAGNOSIS — I10 Essential (primary) hypertension: Secondary | ICD-10-CM

## 2019-07-12 DIAGNOSIS — M7541 Impingement syndrome of right shoulder: Secondary | ICD-10-CM

## 2019-07-12 MED ORDER — METHYLPREDNISOLONE ACETATE 40 MG/ML IJ SUSP
40.0000 mg | Freq: Once | INTRAMUSCULAR | Status: AC
  Administered 2019-07-12: 40 mg via INTRA_ARTICULAR

## 2019-07-12 NOTE — Progress Notes (Signed)
Patient: Erika May Female    DOB: [birth year]   53 y.o.   MRN: [id number] Visit Date: 07/12/2019  Today's Provider: Mar Daring, PA-C   Chief Complaint  Patient presents with  . Bursitis    Right Shoulder   Subjective:    I,Joseline E. Rosas,RMA am acting as a Education administrator for [employer], PA-C.  HPI  Bursitis: Patient c/o bursitis in right shoulder for months. Reports that Advil usually keeps it tolerable but starting to really feel worse and don't want to over do it on the anti-inflammatory.she would like to get a steroid injection today.  HTN: Has been having episodes of elevated BP at home. Always triggered by an external stressor with work. States she can walk away and go put her feet up and relax for 10-15 minutes and BP returns to normal readings. She is taking Maxzide daily. Denies SOB, chest pain, headaches, visual changes.   No Known Allergies   Current Outpatient Medications:  .  Calcium Carb-Cholecalciferol (CALCIUM + D3) 600-200 MG-UNIT TABS, Take by mouth 3 (three) times a week. , Disp: , Rfl:  .  calcium carbonate (TUMS - DOSED IN MG ELEMENTAL CALCIUM) 500 MG chewable tablet, Chew 1 tablet by mouth as needed for indigestion or heartburn., Disp: , Rfl:  .  ibuprofen (ADVIL,MOTRIN) 200 MG tablet, Take 200 mg by mouth every 6 (six) hours as needed for headache. , Disp: , Rfl:  .  levothyroxine (SYNTHROID) 50 MCG tablet, TAKE 1 TABLET BY MOUTH ONCE DAILY BEFORE BREAKFAST, Disp: 90 tablet, Rfl: 1 .  loratadine (CLARITIN) 10 MG tablet, Take 10 mg by mouth daily. am, Disp: , Rfl:  .  MICROGESTIN FE 1.5/30 1.5-30 MG-MCG tablet, Take 1 tablet by mouth once daily, Disp: 3 Package, Rfl: 4 .  Multiple Vitamin (MULTIVITAMIN) capsule, Take 1 capsule by mouth daily. am, Disp: , Rfl:  .  Sennosides-Docusate Sodium (PERI-COLACE PO), Take by mouth as needed., Disp: , Rfl:  .  triamterene-hydrochlorothiazide (MAXZIDE-25) 37.5-25 MG tablet, Take 1 tablet by  mouth once daily, Disp: 90 tablet, Rfl: 1  Review of Systems  Constitutional: Negative.   Eyes: Negative for visual disturbance.  Respiratory: Negative.   Cardiovascular: Negative.   Gastrointestinal: Negative.   Musculoskeletal: Positive for arthralgias.  Neurological: Negative.     Social History   Tobacco Use  . Smoking status: Never Smoker  . Smokeless tobacco: Never Used  Substance Use Topics  . Alcohol use: Yes    Comment: occasional/1 drink 2x week      Objective:   BP (!) 156/93 (BP Location: Left Arm, Patient Position: Sitting, Cuff Size: Normal)   Pulse 92   Temp 98 F (36.7 C) (Oral)   Resp 16   Wt 150 lb 12.8 oz (68.4 kg)   BMI 25.49 kg/m  Vitals:   07/12/19 1122  BP: (!) 156/93  Pulse: 92  Resp: 16  Temp: 98 F (36.7 C)  TempSrc: Oral  Weight: 150 lb 12.8 oz (68.4 kg)  Body mass index is 25.49 kg/m.   Physical Exam Vitals signs reviewed.  Constitutional:      General: She is not in acute distress.    Appearance: Normal appearance. She is well-developed. She is not ill-appearing or diaphoretic.  Neck:     Musculoskeletal: Normal range of motion and neck supple.  Cardiovascular:     Rate and Rhythm: Normal rate and regular rhythm.     Heart sounds: Normal heart  sounds. No murmur. No friction rub. No gallop.   Pulmonary:     Effort: Pulmonary effort is normal. No respiratory distress.     Breath sounds: Normal breath sounds. No wheezing or rales.  Musculoskeletal:     Right shoulder: She exhibits decreased range of motion, tenderness and pain. She exhibits no bony tenderness, no swelling, no effusion, no crepitus, no deformity, no laceration, no spasm, normal pulse and normal strength.  Neurological:     Mental Status: She is alert.      No results found for any visits on 07/12/19.     Assessment & Plan    1. Rotator cuff impingement syndrome of right shoulder Medrol IM given as below, see procedure note. Tolerated well. After care  instructions printed on AVS.  - methylPREDNISolone acetate (DEPO-MEDROL) injection 40 mg  2. Essential hypertension Will monitor at home and send mychart message if staying elevated.   Procedure Note: Benefits, risks (including infection, tattooing, adipose dimpling, and tendon rupture) and alternatives were explained to the patient. All questions were sought and answered.  Patient agreed to continue and verbal consent was obtained.   A steroid injection was performed on right shoulder using 4cc of 1% plain Xyloocaine and 40 mg of depo-medrol. This was well tolerated.     Mar Daring, PA-C  [facility]

## 2019-07-12 NOTE — Patient Instructions (Signed)
Shoulder Injection, Care After Refer to this sheet in the next few weeks. These instructions provide you with information about caring for yourself after your procedure. Your health care provider may also give you more specific instructions. Your treatment has been planned according to current medical practices, but problems sometimes occur. Call your health care provider if you have any problems or questions after your procedure. WHAT TO EXPECT AFTER THE PROCEDURE After your procedure, it is common to have:  Soreness.  Warmth.  Swelling. You may have more pain, swelling, and warmth than you did before the injection. This reaction may last for about one day.  HOME CARE INSTRUCTIONS Bathing  If you were given a bandage (dressing), keep it dry until your health care provider says it can be removed. Ask your health care provider when you can start showering or taking a bath. Managing Pain, Stiffness, and Swelling  If directed, apply ice to the injection area:  Put ice in a plastic bag.  Place a towel between your skin and the bag.  Leave the ice on for 20 minutes, 2-3 times per day.  Do not apply heat to your shoulder.  Raise the injection area above the level of your heart while you are sitting or lying down. Activity  Avoid strenuous activities for as long as directed by your health care provider. Ask your health care provider when you can return to your normal activities. General Instructions  Take medicines only as directed by your health care provider.  Do not take aspirin or other over-the-counter medicines unless your health care provider says you can.  Check your injection site every day for signs of infection. Watch for:  Redness, swelling, or pain.  Fluid, blood, or pus.  Follow your health care provider's instructions about dressing changes and removal. SEEK MEDICAL CARE IF:  You have symptoms at your injection site that last longer than two days after your  procedure.  You have redness, swelling, or pain in your injection area.  You have fluid, blood, or pus coming from your injection site.  You have warmth in your injection area.  You have a fever.  Your pain is not controlled with medicine. SEEK IMMEDIATE MEDICAL CARE IF:  Your shoulder turns very red.  Your shoulder becomes very swollen.  Your shoulder pain is severe.   This information is not intended to replace advice given to you by your health care provider. Make sure you discuss any questions you have with your health care provider.   Document Released: 11/04/2014 Document Reviewed: 11/04/2014 Elsevier Interactive Patient Education 2016 Elsevier Inc.  

## 2019-07-19 ENCOUNTER — Ambulatory Visit
Admission: RE | Admit: 2019-07-19 | Discharge: 2019-07-19 | Disposition: A | Discharge status: Home / Self Care | Payer: BC Managed Care – PPO | Source: Ambulatory Visit | Attending: Physician Assistant | Admitting: Physician Assistant

## 2019-07-19 ENCOUNTER — Telehealth: Payer: Self-pay

## 2019-07-19 DIAGNOSIS — Z1231 Encounter for screening mammogram for malignant neoplasm of breast: Secondary | ICD-10-CM | POA: Diagnosis present

## 2019-07-19 NOTE — Telephone Encounter (Signed)
-----   Message from Mar Daring, [place] sent at 07/19/2019 11:05 AM EDT ----- Normal mammogram. Repeat screening in one year.

## 2019-07-19 NOTE — Telephone Encounter (Signed)
Pt advised.   Thanks,   -Gabriell Casimir  

## 2019-07-21 ENCOUNTER — Telehealth: Payer: Self-pay | Admitting: Physician Assistant

## 2019-07-21 NOTE — Telephone Encounter (Signed)
See my chart message

## 2019-07-22 LAB — BRCASSURE COMPREHENSIVE PANEL

## 2019-07-22 NOTE — Telephone Encounter (Signed)
See mychart messages

## 2019-07-31 ENCOUNTER — Other Ambulatory Visit: Payer: Self-pay | Admitting: Physician Assistant

## 2019-07-31 DIAGNOSIS — E039 Hypothyroidism, unspecified: Secondary | ICD-10-CM

## 2019-08-02 MED ORDER — LEVOTHYROXINE SODIUM 50 MCG PO TABS: ORAL_TABLET | ORAL | 3 refills | Status: DC

## 2019-08-02 NOTE — Addendum Note (Signed)
Addended by: Mar Daring on: 08/02/2019 10:34 AM   Modules accepted: Orders

## 2019-09-04 ENCOUNTER — Other Ambulatory Visit: Payer: Self-pay | Admitting: Physician Assistant

## 2019-09-04 DIAGNOSIS — I10 Essential (primary) hypertension: Secondary | ICD-10-CM

## 2019-10-26 ENCOUNTER — Telehealth: Payer: Self-pay | Admitting: Physician Assistant

## 2019-10-26 NOTE — Telephone Encounter (Signed)
Yes it is ok to switch

## 2019-10-26 NOTE — Telephone Encounter (Signed)
Pharmacy advised  

## 2019-10-26 NOTE — Telephone Encounter (Signed)
[place] faxed refill request for the following medications:  levothyroxine (SYNTHROID) 50 MCG tablet  Note from pharmacy: [place] to switch manufacturer? NTI drug  Please advise.  Thanks, [employer]

## 2019-11-01 ENCOUNTER — Ambulatory Visit: Payer: BC Managed Care – PPO | Attending: Internal Medicine

## 2019-11-01 ENCOUNTER — Other Ambulatory Visit: Payer: BC Managed Care – PPO

## 2019-11-25 ENCOUNTER — Other Ambulatory Visit: Payer: Self-pay | Admitting: Physician Assistant

## 2019-11-25 DIAGNOSIS — I10 Essential (primary) hypertension: Secondary | ICD-10-CM

## 2019-12-10 ENCOUNTER — Encounter: Payer: Self-pay | Admitting: Physician Assistant

## 2019-12-10 ENCOUNTER — Ambulatory Visit: Payer: BC Managed Care – PPO | Admitting: Physician Assistant

## 2019-12-10 ENCOUNTER — Other Ambulatory Visit: Payer: Self-pay

## 2019-12-10 VITALS — BP 140/90 | HR 82 | Temp 97.0°F | Resp 16 | Wt 156.8 lb

## 2019-12-10 DIAGNOSIS — I10 Essential (primary) hypertension: Secondary | ICD-10-CM | POA: Diagnosis not present

## 2019-12-10 DIAGNOSIS — E663 Overweight: Secondary | ICD-10-CM

## 2019-12-10 MED ORDER — TRIAMTERENE-HCTZ 37.5-25 MG PO TABS: 1.0000 | ORAL_TABLET | Freq: Every day | ORAL | 1 refills | Status: DC

## 2019-12-10 MED ORDER — PHENTERMINE HCL 37.5 MG PO TABS: 37.5000 mg | ORAL_TABLET | Freq: Every day | ORAL | 2 refills | Status: DC

## 2019-12-10 NOTE — Patient Instructions (Signed)
DASH Eating Plan DASH stands for "Dietary Approaches to Stop Hypertension." The DASH eating plan is a healthy eating plan that has been shown to reduce high blood pressure (hypertension). It may also reduce your risk for type 2 diabetes, heart disease, and stroke. The DASH eating plan may also help with weight loss. What are tips for following this plan?  General guidelines  Avoid eating more than 2,300 mg (milligrams) of salt (sodium) a day. If you have hypertension, you may need to reduce your sodium intake to 1,500 mg a day.  Limit alcohol intake to no more than 1 drink a day for nonpregnant women and 2 drinks a day for men. One drink equals 12 oz of beer, 5 oz of wine, or 1 oz of hard liquor.  Work with your health care provider to maintain a healthy body weight or to lose weight. Ask what an ideal weight is for you.  Get at least 30 minutes of exercise that causes your heart to beat faster (aerobic exercise) most days of the week. Activities may include walking, swimming, or biking.  Work with your health care provider or diet and nutrition specialist (dietitian) to adjust your eating plan to your individual calorie needs. Reading food labels   Check food labels for the amount of sodium per serving. Choose foods with less than 5 percent of the Daily Value of sodium. Generally, foods with less than 300 mg of sodium per serving fit into this eating plan.  To find whole grains, look for the word "whole" as the first word in the ingredient list. Shopping  Buy products labeled as "low-sodium" or "no salt added."  Buy fresh foods. Avoid canned foods and premade or frozen meals. Cooking  Avoid adding salt when cooking. Use salt-free seasonings or herbs instead of table salt or sea salt. Check with your health care provider or pharmacist before using salt substitutes.  Do not fry foods. Cook foods using healthy methods such as baking, boiling, grilling, and broiling instead.  Cook with  heart-healthy oils, such as olive, canola, soybean, or sunflower oil. Meal planning  Eat a balanced diet that includes: ? 5 or more servings of fruits and vegetables each day. At each meal, try to fill half of your plate with fruits and vegetables. ? Up to 6-8 servings of whole grains each day. ? Less than 6 oz of lean meat, poultry, or fish each day. A 3-oz serving of meat is about the same size as a deck of cards. One egg equals 1 oz. ? 2 servings of low-fat dairy each day. ? A serving of nuts, seeds, or beans 5 times each week. ? Heart-healthy fats. Healthy fats called Omega-3 fatty acids are found in foods such as flaxseeds and coldwater fish, like sardines, salmon, and mackerel.  Limit how much you eat of the following: ? Canned or prepackaged foods. ? Food that is high in trans fat, such as fried foods. ? Food that is high in saturated fat, such as fatty meat. ? Sweets, desserts, sugary drinks, and other foods with added sugar. ? Full-fat dairy products.  Do not salt foods before eating.  Try to eat at least 2 vegetarian meals each week.  Eat more home-cooked food and less restaurant, buffet, and fast food.  When eating at a restaurant, ask that your food be prepared with less salt or no salt, if possible. What foods are recommended? The items listed may not be a complete list. Talk with your dietitian about   what dietary choices are best for you. Grains Whole-grain or whole-wheat bread. Whole-grain or whole-wheat pasta. Brown rice. Oatmeal. Quinoa. Bulgur. Whole-grain and low-sodium cereals. Pita bread. Low-fat, low-sodium crackers. Whole-wheat flour tortillas. Vegetables Fresh or frozen vegetables (raw, steamed, roasted, or grilled). Low-sodium or reduced-sodium tomato and vegetable juice. Low-sodium or reduced-sodium tomato sauce and tomato paste. Low-sodium or reduced-sodium canned vegetables. Fruits All fresh, dried, or frozen fruit. Canned fruit in natural juice (without  added sugar). Meat and other protein foods Skinless chicken or turkey. Ground chicken or turkey. Pork with fat trimmed off. Fish and seafood. Egg whites. Dried beans, peas, or lentils. Unsalted nuts, nut butters, and seeds. Unsalted canned beans. Lean cuts of beef with fat trimmed off. Low-sodium, lean deli meat. Dairy Low-fat (1%) or fat-free (skim) milk. Fat-free, low-fat, or reduced-fat cheeses. Nonfat, low-sodium ricotta or cottage cheese. Low-fat or nonfat yogurt. Low-fat, low-sodium cheese. Fats and oils Soft margarine without trans fats. Vegetable oil. Low-fat, reduced-fat, or light mayonnaise and salad dressings (reduced-sodium). Canola, safflower, olive, soybean, and sunflower oils. Avocado. Seasoning and other foods Herbs. Spices. Seasoning mixes without salt. Unsalted popcorn and pretzels. Fat-free sweets. What foods are not recommended? The items listed may not be a complete list. Talk with your dietitian about what dietary choices are best for you. Grains Baked goods made with fat, such as croissants, muffins, or some breads. Dry pasta or rice meal packs. Vegetables Creamed or fried vegetables. Vegetables in a cheese sauce. Regular canned vegetables (not low-sodium or reduced-sodium). Regular canned tomato sauce and paste (not low-sodium or reduced-sodium). Regular tomato and vegetable juice (not low-sodium or reduced-sodium). Pickles. Olives. Fruits Canned fruit in a light or heavy syrup. Fried fruit. Fruit in cream or butter sauce. Meat and other protein foods Fatty cuts of meat. Ribs. Fried meat. Bacon. Sausage. Bologna and other processed lunch meats. Salami. Fatback. Hotdogs. Bratwurst. Salted nuts and seeds. Canned beans with added salt. Canned or smoked fish. Whole eggs or egg yolks. Chicken or turkey with skin. Dairy Whole or 2% milk, cream, and half-and-half. Whole or full-fat cream cheese. Whole-fat or sweetened yogurt. Full-fat cheese. Nondairy creamers. Whipped toppings.  Processed cheese and cheese spreads. Fats and oils Butter. Stick margarine. Lard. Shortening. Ghee. Bacon fat. Tropical oils, such as coconut, palm kernel, or palm oil. Seasoning and other foods Salted popcorn and pretzels. Onion salt, garlic salt, seasoned salt, table salt, and sea salt. Worcestershire sauce. Tartar sauce. Barbecue sauce. Teriyaki sauce. Soy sauce, including reduced-sodium. Steak sauce. Canned and packaged gravies. Fish sauce. Oyster sauce. Cocktail sauce. Horseradish that you find on the shelf. Ketchup. Mustard. Meat flavorings and tenderizers. Bouillon cubes. Hot sauce and Tabasco sauce. Premade or packaged marinades. Premade or packaged taco seasonings. Relishes. Regular salad dressings. Where to find more information:  National Heart, Lung, and Blood Institute: www.nhlbi.nih.gov  American Heart Association: www.heart.org Summary  The DASH eating plan is a healthy eating plan that has been shown to reduce high blood pressure (hypertension). It may also reduce your risk for type 2 diabetes, heart disease, and stroke.  With the DASH eating plan, you should limit salt (sodium) intake to 2,300 mg a day. If you have hypertension, you may need to reduce your sodium intake to 1,500 mg a day.  When on the DASH eating plan, aim to eat more fresh fruits and vegetables, whole grains, lean proteins, low-fat dairy, and heart-healthy fats.  Work with your health care provider or diet and nutrition specialist (dietitian) to adjust your eating plan to your   individual calorie needs. This information is not intended to replace advice given to you by your health care provider. Make sure you discuss any questions you have with your health care provider. Document Revised: 09/26/2017 Document Reviewed: 10/07/2016 Elsevier Patient Education  2020 Elsevier Inc.  

## 2019-12-10 NOTE — Progress Notes (Signed)
Patient: Erika May Female    DOB: 17-Apr-1966   54 y.o.   MRN: [id number] Visit Date: 12/10/2019  Today's Provider: Mar Daring, PA-C   Chief Complaint  Patient presents with  . Follow-up    Hypertension   Subjective:     HPI  Essential hypertension: Patient currently on Triamterene-HCTZ. Patient reports that her blood pressure is doing better. She reports the blood pressure average between 120's/77. Diet is unhealthy, but is adherent to low salt diet. Denies Chest pain, sob, dizziness,light headedness or visual disturbances.  No Known Allergies   Current Outpatient Medications:  .  acetaminophen (TYLENOL 8 HOUR ARTHRITIS PAIN) 650 MG CR tablet, Take 650 mg by mouth every 8 (eight) hours as needed for pain., Disp: , Rfl:  .  Calcium Carb-Cholecalciferol (CALCIUM + D3) 600-200 MG-UNIT TABS, Take by mouth 3 (three) times a week. , Disp: , Rfl:  .  calcium carbonate (TUMS - DOSED IN MG ELEMENTAL CALCIUM) 500 MG chewable tablet, Chew 1 tablet by mouth as needed for indigestion or heartburn., Disp: , Rfl:  .  ibuprofen (ADVIL,MOTRIN) 200 MG tablet, Take 200 mg by mouth every 6 (six) hours as needed for headache. , Disp: , Rfl:  .  levothyroxine (SYNTHROID) 50 MCG tablet, TAKE 1 TABLET BY MOUTH ONCE DAILY BEFORE BREAKFAST, Disp: 90 tablet, Rfl: 3 .  loratadine (CLARITIN) 10 MG tablet, Take 10 mg by mouth daily. am, Disp: , Rfl:  .  MICROGESTIN FE 1.5/30 1.5-30 MG-MCG tablet, Take 1 tablet by mouth once daily, Disp: 3 Package, Rfl: 4 .  Multiple Vitamin (MULTIVITAMIN) capsule, Take 1 capsule by mouth daily. am, Disp: , Rfl:  .  Sennosides-Docusate Sodium (PERI-COLACE PO), Take by mouth as needed., Disp: , Rfl:  .  triamterene-hydrochlorothiazide (MAXZIDE-25) 37.5-25 MG tablet, Take 1 tablet by mouth once daily, Disp: 30 tablet, Rfl: 0  Review of Systems  Eyes: Negative for visual disturbance.  Respiratory: Negative for shortness of breath.   Cardiovascular:  Negative for chest pain and palpitations.  Neurological: Negative for dizziness and light-headedness.    Social History   Tobacco Use  . Smoking status: Never Smoker  . Smokeless tobacco: Never Used  Substance Use Topics  . Alcohol use: Yes    Comment: occasional/1 drink 2x week      Objective:   BP 140/90 (BP Location: Left Arm, Patient Position: Sitting, Cuff Size: Large)   Pulse 82   Temp (!) 97 F (36.1 C) (Temporal)   Resp 16   Wt 156 lb 12.8 oz (71.1 kg)   BMI 26.50 kg/m  Vitals:   12/10/19 1326  BP: 140/90  Pulse: 82  Resp: 16  Temp: (!) 97 F (36.1 C)  TempSrc: Temporal  Weight: 156 lb 12.8 oz (71.1 kg)  Body mass index is 26.5 kg/m.   Physical Exam Vitals reviewed.  Constitutional:      General: She is not in acute distress.    Appearance: Normal appearance. She is well-developed. She is not ill-appearing or diaphoretic.  Neck:     Thyroid: No thyromegaly.     Vascular: No JVD.     Trachea: No tracheal deviation.  Cardiovascular:     Rate and Rhythm: Normal rate and regular rhythm.     Pulses: Normal pulses.     Heart sounds: Normal heart sounds. No murmur. No friction rub. No gallop.   Pulmonary:     Effort: Pulmonary effort is normal. No respiratory distress.  Breath sounds: Normal breath sounds. No wheezing or rales.  Musculoskeletal:     Cervical back: Normal range of motion and neck supple.  Lymphadenopathy:     Cervical: No cervical adenopathy.  Neurological:     Mental Status: She is alert.  Psychiatric:        Mood and Affect: Mood normal.        Behavior: Behavior normal.        Thought Content: Thought content normal.        Judgment: Judgment normal.      No results found for any visits on 12/10/19.     Assessment & Plan    1. Essential hypertension Stable. Diagnosis pulled for medication refill. Continue current medical treatment plan. - triamterene-hydrochlorothiazide (MAXZIDE-25) 37.5-25 MG tablet; Take 1 tablet by  mouth daily.  Dispense: 90 tablet; Refill: 1  2. Overweight (BMI 25.0-29.9) Counseled patient on healthy lifestyle modifications including dieting and exercise. Will restart Phentermine as below and monitor BP.  - phentermine (ADIPEX-P) 37.5 MG tablet; Take 1 tablet (37.5 mg total) by mouth daily before breakfast.  Dispense: 30 tablet; Refill: [place], PA-C  [facility]

## 2020-02-24 ENCOUNTER — Inpatient Hospital Stay: Payer: BC Managed Care – PPO | Attending: Oncology | Admitting: Licensed Clinical Social Worker

## 2020-02-24 ENCOUNTER — Encounter: Payer: Self-pay | Admitting: Licensed Clinical Social Worker

## 2020-02-24 DIAGNOSIS — Z8042 Family history of malignant neoplasm of prostate: Secondary | ICD-10-CM

## 2020-02-24 DIAGNOSIS — Z800 Family history of malignant neoplasm of digestive organs: Secondary | ICD-10-CM

## 2020-02-24 DIAGNOSIS — Z803 Family history of malignant neoplasm of breast: Secondary | ICD-10-CM

## 2020-02-24 DIAGNOSIS — Z808 Family history of malignant neoplasm of other organs or systems: Secondary | ICD-10-CM

## 2020-02-24 NOTE — Progress Notes (Signed)
REFERRING PROVIDER: Mar Daring, PA-C [address] [place],  [place] [address]  PRIMARY PROVIDER:  Mar Daring, PA-C  PRIMARY REASON FOR VISIT:  1. Family history of breast cancer   2. Family history of melanoma   3. Family history of colon cancer   4. Family history of prostate cancer    I connected with Erika May on 02/24/2020 at 9: 50 AM EDT by MyChart video conference and verified that I am speaking with the correct person using two identifiers.    Patient location: home Provider location: [place]:   Erika May, a 54 y.o. female, was seen for a [place] cancer genetics consultation at the request of Dr. Marlyn Corporal due to a family history of cancer.  Erika May presents to clinic today to discuss the possibility of a hereditary predisposition to cancer, genetic testing, and to further clarify her future cancer risks, as well as potential cancer risks for family members.   Erika May is a 54 y.o. female with no personal history of cancer.  She reports having genetic counseling recently through [facility] but was told she would have to pay out of pocket for the testing which she declined at the time.   CANCER HISTORY:  Oncology History   No history exists.     RISK FACTORS:  Menarche was at age 26.  First live birth at age 73.  OCP use: 25 years HRT use: 0 years. Number of breast biopsies: 1. Up to date with pelvic exams: yes. Any excessive radiation exposure in the past: no  Past Medical History:  Diagnosis Date  . 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 DDD (degenerative disc disease), lumbar   . Family history of breast cancer   . Family history of colon cancer   . Family history of melanoma   . Family history of prostate cancer   . GERD (gastroesophageal reflux disease)    heartburn occasional  .  Headache    1/week  . Hypothyroidism   . Incontinence   . Joint stiffness   . Rhinitis, allergic     Past Surgical History:  Procedure Laterality Date  . AUGMENTATION MAMMAPLASTY Bilateral 6195   silicone  . BREAST CYST EXCISION Bilateral 1990's   benign  . CERVICAL BIOPSY  W/ LOOP ELECTRODE EXCISION  10/2009  . COLONOSCOPY N/A 05/08/2015   Procedure: COLONOSCOPY;  Surgeon: Lucilla Lame, MD;  Location: [facility];  Service: Gastroenterology;  Laterality: N/A;  . WISDOM TOOTH EXTRACTION      Social History   Socioeconomic History  . Marital status: Married    Spouse name: Not on file  . Number of children: Not on file  . Years of education: Not on file  . Highest education level: Not on file  Occupational History  . Not on file  Tobacco Use  . Smoking status: Never Smoker  . Smokeless tobacco: Never Used  Substance and Sexual Activity  . Alcohol use: Yes    Comment: occasional/1 drink 2x week  . Drug use: No  . Sexual activity: Yes    Birth control/protection: Pill  Other Topics Concern  . Not on file  Social History Narrative  . Not on file   Social Determinants of Health   Financial Resource Strain:   . Difficulty of Paying Living Expenses:  Food Insecurity:   . Worried About Charity fundraiser in the Last Year:   . Arboriculturist in the Last Year:   Transportation Needs:   . Film/video editor (Medical):   Marland Kitchen Lack of Transportation (Non-Medical):   Physical Activity:   . Days of Exercise per Week:   . Minutes of Exercise per Session:   Stress:   . Feeling of Stress :   Social Connections:   . Frequency of Communication with Friends and Family:   . Frequency of Social Gatherings with Friends and Family:   . Attends Religious Services:   . Active Member of Clubs or Organizations:   . Attends Archivist Meetings:   Marland Kitchen Marital Status:      FAMILY HISTORY:  We obtained a detailed, 4-generation family history.  Significant  diagnoses are listed below: Family History  Problem Relation Age of Onset  . Arthritis Mother   . Hypothyroidism Mother   . Hypertension Mother   . Hyperlipidemia Mother   . Transient ischemic attack Mother   . Cancer Mother        breast cancer  . Breast cancer Mother 68  . Melanoma Father        dx 10s  . Hyperlipidemia Father   . Cataracts Father   . Emphysema Father   . Healthy Sister   . Breast cancer Maternal Aunt        great aunt x2  . Prostate cancer Paternal Uncle        metastatic  . Colon cancer Maternal Grandfather        dx 76s  . Skin cancer Paternal Grandfather    Erika May has one son, 52. She has one sister, 71.   Erika May's mother was diagnosed with breast cancer at 75 and is living at 55. She is an only child. Patient's maternal grandfather had colon cancer in his 69s and passed in his 25s. Patient's maternal grandmother died at 21. She had 2 sisters, patient's maternal great aunts, who had breast cancer.  Erika May's father was diagnosed with melanoma in his 49s. He passed at 34. Patient has 2 paternal aunts and 1 paternal uncle. Her uncle had metastatic prostate cancer and died at 76. No cancers in paternal cousins. Paternal grandmother passed in his 64s-90s. Grandfather had skin cancer. He passed in his 58s.   Erika May is unaware of previous family history of genetic testing for hereditary cancer risks. Patient's maternal ancestors are of [place] descent, and paternal ancestors are of [place] descent. There is no reported Ashkenazi Jewish ancestry. There is no known consanguinity.  GENETIC COUNSELING ASSESSMENT: Erika May is a 54 y.o. female with a family history of breast cancer which is somewhat suggestive of a hereditary cancer syndrome and predisposition to cancer. We, therefore, discussed and recommended the following at today's visit.   DISCUSSION: We discussed that 5 - 10% of breast cancer is hereditary,  with most cases associated with BRCA1/BRCA2 mutations.  There are other genes that can be associated with hereditary breast cancer syndromes. We discussed that testing is beneficial for several reasons including knowing how to follow individuals for cancer screenings and understand if other family members could be at risk for cancer and allow them to undergo genetic testing.   We reviewed the characteristics, features and inheritance patterns of hereditary cancer syndromes. We also discussed genetic testing, including the appropriate family members to test, the process  of testing, insurance coverage and turn-around-time for results. We discussed the implications of a negative, positive and/or variant of uncertain significant result. We recommended Erika May pursue genetic testing for the [facility] Multi-Cancer gene panel.   The Multi-Cancer Panel offered by [facility] includes sequencing and/or deletion duplication testing of the following 85 genes: AIP, ALK, APC, ATM, AXIN2,BAP1,  BARD1, BLM, BMPR1A, BRCA1, BRCA2, BRIP1, CASR, CDC73, CDH1, CDK4, CDKN1B, CDKN1C, CDKN2A (p14ARF), CDKN2A (p16INK4a), CEBPA, CHEK2, CTNNA1, DICER1, DIS3L2, EGFR (c.2369C>T, p.Thr790Met variant only), EPCAM (Deletion/duplication testing only), FH, FLCN, GATA2, GPC3, GREM1 (Promoter region deletion/duplication testing only), HOXB13 (c.251G>A, p.Gly84Glu), HRAS, KIT, MAX, MEN1, MET, MITF (c.952G>A, p.Glu318Lys variant only), MLH1, MSH2, MSH3, MSH6, MUTYH, NBN, NF1, NF2, NTHL1, PALB2, PDGFRA, PHOX2B, PMS2, POLD1, POLE, POT1, PRKAR1A, PTCH1, PTEN, RAD50, RAD51C, RAD51D, RB1, RECQL4, RET, RNF43, RUNX1, SDHAF2, SDHA (sequence changes only), SDHB, SDHC, SDHD, SMAD4, SMARCA4, SMARCB1, SMARCE1, STK11, SUFU, TERC, TERT, TMEM127, TP53, TSC1, TSC2, VHL, WRN and WT1.   Based on Erika May's family history of cancer, she meets medical criteria for genetic testing. Despite that she meets criteria, she may still have an out of pocket cost.  We discussed that if her out of pocket cost for testing is over $100, the laboratory will call and confirm whether she wants to proceed with testing.  If the out of pocket cost of testing is less than $100 she will be billed by the genetic testing laboratory.   PLAN: After considering the risks, benefits, and limitations, Erika May provided informed consent to pursue genetic testing. A saliva kit will be mailed to her and sample will be sent to [facility] for analysis of the Multi-Cancer Panel. Results should be available within approximately 2-3 weeks' time, at which point they will be disclosed by telephone to Erika May, as will any additional recommendations warranted by these results. Erika May will receive a summary of her genetic counseling visit and a copy of her results once available. This information will also be available in Epic.   Erika May's questions were answered to her satisfaction today. Our contact information was provided should additional questions or concerns arise. Thank you for the referral and allowing [place] to share in the care of your patient.   Faith Rogue, MS, [facility] Genetic Counselor [email or web address] Phone: [phone]  The patient was seen for a total of 25 minutes in face-to-face genetic counseling.  Drs. Magrinat, Lindi Adie and/or Burr Medico were available for discussion regarding this case.   _______________________________________________________________________ For Office Staff:  Number of people involved in session: 1 Was an Intern/ student involved with case: no

## 2020-03-13 ENCOUNTER — Encounter: Payer: Self-pay | Admitting: Licensed Clinical Social Worker

## 2020-03-13 ENCOUNTER — Ambulatory Visit: Payer: Self-pay | Admitting: Licensed Clinical Social Worker

## 2020-03-13 ENCOUNTER — Telehealth: Payer: Self-pay | Admitting: Licensed Clinical Social Worker

## 2020-03-13 DIAGNOSIS — Z1379 Encounter for other screening for genetic and chromosomal anomalies: Secondary | ICD-10-CM

## 2020-03-13 DIAGNOSIS — Z8042 Family history of malignant neoplasm of prostate: Secondary | ICD-10-CM

## 2020-03-13 DIAGNOSIS — Z803 Family history of malignant neoplasm of breast: Secondary | ICD-10-CM

## 2020-03-13 DIAGNOSIS — Z800 Family history of malignant neoplasm of digestive organs: Secondary | ICD-10-CM

## 2020-03-13 DIAGNOSIS — Z808 Family history of malignant neoplasm of other organs or systems: Secondary | ICD-10-CM

## 2020-03-13 NOTE — Telephone Encounter (Signed)
Revealed negative genetic testing.  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 This normal result is reassuring.  It is unlikely that there is an increased risk of cancer due to a mutation in one of these genes.  However, genetic testing is not perfect, and cannot definitively rule out a hereditary cause.  It will be important for her to keep in contact with genetics to learn if any additional testing may be needed in the future.     

## 2020-03-13 NOTE — Progress Notes (Signed)
HPI:  Erika May was previously seen in the [place] clinic due to a family history of cancer and concerns regarding a hereditary predisposition to cancer. Please refer to our prior cancer genetics clinic note for more information regarding our discussion, assessment and recommendations, at the time. Erika May's recent genetic test results were disclosed to her, as were recommendations warranted by these results. These results and recommendations are discussed in more detail below.  CANCER HISTORY:  Oncology History   No history exists.    FAMILY HISTORY:  We obtained a detailed, 4-generation family history.  Significant diagnoses are listed below: Family History  Problem Relation Age of Onset  . Arthritis Mother   . Hypothyroidism Mother   . Hypertension Mother   . Hyperlipidemia Mother   . Transient ischemic attack Mother   . Cancer Mother        breast cancer  . Breast cancer Mother 33  . Melanoma Father        dx 33s  . Hyperlipidemia Father   . Cataracts Father   . Emphysema Father   . Healthy Sister   . Breast cancer Maternal Aunt        great aunt x2  . Prostate cancer Paternal Uncle        metastatic  . Colon cancer Maternal Grandfather        dx 49s  . Skin cancer Paternal Grandfather     Erika May has one son, 56. She has one sister, 28.   Erika May's mother was diagnosed with breast cancer at 26 and is living at 79. She is an only child. Patient's maternal grandfather had colon cancer in his 5s and passed in his 44s. Patient's maternal grandmother died at 40. She had 2 sisters, patient's maternal great aunts, who had breast cancer.  Erika May's father was diagnosed with melanoma in his 32s. He passed at 68. Patient has 2 paternal aunts and 1 paternal uncle. Her uncle had metastatic prostate cancer and died at 89. No cancers in paternal cousins. Paternal grandmother passed in his 72s-90s. Grandfather had skin  cancer. He passed in his 27s.   Erika May is unaware of previous family history of genetic testing for hereditary cancer risks. Patient's maternal ancestors are of [place] descent, and paternal ancestors are of [place] descent. There is no reported Ashkenazi Jewish ancestry. There is no known consanguinity.  GENETIC TEST RESULTS: Genetic testing reported out on 03/07/2020 through the [facility] Multi- cancer panel found no pathogenic mutations.  The Multi-Cancer Panel offered by [facility] includes sequencing and/or deletion duplication testing of the following 85 genes: AIP, ALK, APC, ATM, AXIN2,BAP1,  BARD1, BLM, BMPR1A, BRCA1, BRCA2, BRIP1, CASR, CDC73, CDH1, CDK4, CDKN1B, CDKN1C, CDKN2A (p14ARF), CDKN2A (p16INK4a), CEBPA, CHEK2, CTNNA1, DICER1, DIS3L2, EGFR (c.2369C>T, p.Thr790Met variant only), EPCAM (Deletion/duplication testing only), FH, FLCN, GATA2, GPC3, GREM1 (Promoter region deletion/duplication testing only), HOXB13 (c.251G>A, p.Gly84Glu), HRAS, KIT, MAX, MEN1, MET, MITF (c.952G>A, p.Glu318Lys variant only), MLH1, MSH2, MSH3, MSH6, MUTYH, NBN, NF1, NF2, NTHL1, PALB2, PDGFRA, PHOX2B, PMS2, POLD1, POLE, POT1, PRKAR1A, PTCH1, PTEN, RAD50, RAD51C, RAD51D, RB1, RECQL4, RET, RNF43, RUNX1, SDHAF2, SDHA (sequence changes only), SDHB, SDHC, SDHD, SMAD4, SMARCA4, SMARCB1, SMARCE1, STK11, SUFU, TERC, TERT, TMEM127, TP53, TSC1, TSC2, VHL, WRN and WT1.   The test report has been scanned into EPIC and is located under the Molecular Pathology section of the Results Review tab.  A portion of the result report is included below  for reference.     We discussed with Erika May that because current genetic testing is not perfect, it is possible there May be a gene mutation in one of these genes that current testing cannot detect, but that chance is small.  We also discussed, that there could be another gene that has not yet been discovered, or that we have not yet tested, that is responsible for  the cancer diagnoses in the family. It is also possible there is a hereditary cause for the cancer in the family that Erika May did not inherit and therefore was not identified in her testing.  Therefore, it is important to remain in touch with cancer genetics in the future so that we can continue to offer Erika May the most up to date genetic testing.   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  At this time, it is unknown if this variant is associated with increased cancer risk or if this is a normal finding, but most variants such as this get reclassified to being inconsequential. It should not be used to make medical management decisions. With time, we suspect the lab will determine the significance of this variant, if any. If we do learn more about it, we will try to contact Erika May to discuss it further. However, it is important to stay in touch with [place] periodically and keep the address and phone number up to date.  ADDITIONAL GENETIC TESTING: We discussed with Erika May that her genetic testing was fairly extensive.  If there are genes identified to increase cancer risk that can be analyzed in the future, we would be happy to discuss and coordinate this testing at that time.    CANCER SCREENING RECOMMENDATIONS: Erika May's test result is considered negative (normal).  This means that we have not identified a hereditary cause for her family history of cancer at this time.   While reassuring, this does not definitively rule out a hereditary predisposition to cancer. It is still possible that there could be genetic mutations that are undetectable by current technology. There could be genetic mutations in genes that have not been tested or identified to increase cancer risk.  Therefore, it is recommended she continue to follow the cancer management and screening guidelines provided by  her  primary healthcare provider.   An individual's cancer risk and medical management are not determined by genetic test results alone. Overall cancer risk assessment incorporates additional factors, including personal medical history, family history, and any available genetic information that May result in a personalized plan for cancer prevention and surveillance.  Based on Erika May's family history of cancer, as well as her genetic test results, statistical models Harriett Rush) was used to estimate her risk of developing breast cancer. This estiamtes her lifetime risk of developing breast cancer to be approximately 10.5%.  The patient's lifetime breast cancer risk is a preliminary estimate based on available information using one of several models endorsed by the [place] (ACS). The ACS recommends consideration of breast MRI screening as an adjunct to mammography for patients at high risk (defined as 20% or greater lifetime risk).     RECOMMENDATIONS FOR FAMILY MEMBERS:  Relatives in this family might be at some increased risk of developing cancer, over the general population risk, simply due to the family history of cancer.  We recommended female relatives in this family have  a yearly mammogram beginning at age 18, or 65 years younger than the earliest onset of cancer, an annual clinical breast exam, and perform monthly breast self-exams. Female relatives in this family should also have a gynecological exam as recommended by their primary provider. All family members should have a colonoscopy by age 72, or as directed by their physicians.  It is also possible there is a hereditary cause for the cancer in Erika May's family that she did not inherit and therefore was not identified in her.  Based on Erika May's family history, we recommended her sister/relatives who have had cancer have genetic counseling and testing. Erika May will let us know if we can  be of any assistance in coordinating genetic counseling and/or testing for these family members.  FOLLOW-UP: Lastly, we discussed with Erika May that cancer genetics is a rapidly advancing field and it is possible that new genetic tests will be appropriate for her and/or her family members in the future. We encouraged her to remain in contact with cancer genetics on an annual basis so we can update her personal and family histories and let her know of advances in cancer genetics that May benefit this family.   Our contact number was provided. Erika May's questions were answered to her satisfaction, and she knows she is welcome to call us at anytime with additional questions or concerns.   Faith Rogue, MS, [facility], [place] Genetic Counselor [email or web address] Phone: [phone]

## 2020-03-14 ENCOUNTER — Encounter: Payer: Self-pay | Admitting: Physician Assistant

## 2020-03-14 DIAGNOSIS — E663 Overweight: Secondary | ICD-10-CM

## 2020-03-20 MED ORDER — PHENTERMINE HCL 37.5 MG PO TABS: 37.5000 mg | ORAL_TABLET | Freq: Every day | ORAL | 2 refills | Status: DC

## 2020-06-13 NOTE — Progress Notes (Signed)
Complete physical exam   Patient: Erika May   DOB: [birth year]   54 y.o. Female  MRN: [id number] Visit Date: 06/16/2020  Today's healthcare provider: Mar Daring, PA-C   Chief Complaint  Patient presents with   Annual Exam   Subjective    Erika May is a 54 y.o. female who presents today for a complete physical exam.  She reports consuming a low fat and low sodium diet. Gym/ health club routine includes cardio, light weights, stationary bike and treadmill. She generally feels well. She reports sleeping well. She does not have additional problems to discuss today.  HPI  Last pap:04/12/18-Pap is normal, HPV negative. Will repeat in 5 years.  Past Medical History:  Diagnosis Date   Allergic dermatitis due to poison ivy    Broken neck ([place])    broke C6 as a child/ experiences stiffness   DDD (degenerative disc disease), lumbar    Family history of breast cancer    Family history of colon cancer    Family history of melanoma    Family history of prostate cancer    GERD (gastroesophageal reflux disease)    heartburn occasional   Headache    1/week   Hypothyroidism    Incontinence    Joint stiffness    Rhinitis, allergic    Past Surgical History:  Procedure Laterality Date   AUGMENTATION MAMMAPLASTY Bilateral 6073   silicone   BREAST CYST EXCISION Bilateral 1990's   benign   CERVICAL BIOPSY  W/ LOOP ELECTRODE EXCISION  10/2009   COLONOSCOPY N/A 05/08/2015   Procedure: COLONOSCOPY;  Surgeon: Lucilla Lame, MD;  Location: [facility];  Service: Gastroenterology;  Laterality: N/A;   WISDOM TOOTH EXTRACTION     Social History   Socioeconomic History   Marital status: Married    Spouse name: Not on file   Number of children: Not on file   Years of education: Not on file   Highest education level: Not on file  Occupational History   Not on file  Tobacco Use   Smoking status: Never Smoker   Smokeless  tobacco: Never Used  Vaping Use   Vaping Use: Never used  Substance and Sexual Activity   Alcohol use: Yes    Comment: occasional/1 drink 2x week   Drug use: No   Sexual activity: Yes    Birth control/protection: Pill  Other Topics Concern   Not on file  Social History Narrative   Not on file   Social Determinants of Health   Financial Resource Strain:    Difficulty of Paying Living Expenses: Not on file  Food Insecurity:    Worried About Charity fundraiser in the Last Year: Not on file   [employer] of Food in the Last Year: Not on file  Transportation Needs:    Lack of Transportation (Medical): Not on file   Lack of Transportation (Non-Medical): Not on file  Physical Activity:    Days of Exercise per Week: Not on file   Minutes of Exercise per Session: Not on file  Stress:    Feeling of Stress : Not on file  Social Connections:    Frequency of Communication with Friends and Family: Not on file   Frequency of Social Gatherings with Friends and Family: Not on file   Attends Religious Services: Not on file   Active Member of Clubs or Organizations: Not on file   Attends Archivist Meetings: Not on file  Marital Status: Not on file  Intimate Partner Violence:    Fear of Current or Ex-Partner: Not on file   Emotionally Abused: Not on file   Physically Abused: Not on file   Sexually Abused: Not on file   Family Status  Relation Name Status   Mother  Alive   Father  Deceased at age 25   Sister  Alive   Mat Architectural technologist  (Not Specified)   Fr[place] Din Aunt x2 [place]  Deceased   MGM  Deceased   MGF  Deceased   PGM  Deceased   PGF  Deceased   Son  Alive   Family History  Problem Relation Age of Onset   Arthritis Mother    Hypothyroidism Mother    Hypertension Mother    Hyperlipidemia Mother    Transient ischemic attack Mother    Cancer Mother        breast cancer   Breast cancer Mother 74   Melanoma Father         dx 32s   Hyperlipidemia Father    Cataracts Father    Emphysema Father    Healthy Sister    Breast cancer Maternal Aunt        great aunt x2   Prostate cancer Paternal Uncle        metastatic   Colon cancer Maternal Grandfather        dx 29s   Skin cancer Paternal Grandfather    No Known Allergies  Patient Care Team: Mar Daring, PA-C as PCP - General (Family Medicine)   Medications: Outpatient Medications Prior to Visit  Medication Sig   acetaminophen (TYLENOL 8 HOUR ARTHRITIS PAIN) 650 MG CR tablet Take 650 mg by mouth every 8 (eight) hours as needed for pain.   Calcium Carb-Cholecalciferol (CALCIUM + D3) 600-200 MG-UNIT TABS Take by mouth 3 (three) times a week.    calcium carbonate (TUMS - DOSED IN MG ELEMENTAL CALCIUM) 500 MG chewable tablet Chew 1 tablet by mouth as needed for indigestion or heartburn.   ibuprofen (ADVIL,MOTRIN) 200 MG tablet Take 200 mg by mouth every 6 (six) hours as needed for headache.    levothyroxine (SYNTHROID) 50 MCG tablet TAKE 1 TABLET BY MOUTH ONCE DAILY BEFORE BREAKFAST   loratadine (CLARITIN) 10 MG tablet Take 10 mg by mouth daily. am   Multiple Vitamin (MULTIVITAMIN) capsule Take 1 capsule by mouth daily. am   phentermine (ADIPEX-P) 37.5 MG tablet Take 1 tablet (37.5 mg total) by mouth daily before breakfast.   Sennosides-Docusate Sodium (PERI-COLACE PO) Take by mouth as needed.   Turmeric (QC TUMERIC COMPLEX) 500 MG CAPS Take by mouth.   [DISCONTINUED] MICROGESTIN FE 1.5/30 1.5-30 MG-MCG tablet Take 1 tablet by mouth once daily   [DISCONTINUED] triamterene-hydrochlorothiazide (MAXZIDE-25) 37.5-25 MG tablet Take 1 tablet by mouth daily.   No facility-administered medications prior to visit.    Review of Systems  Constitutional: Positive for diaphoresis.  HENT: Negative.   Eyes: Negative.   Respiratory: Negative.   Cardiovascular: Negative.   Gastrointestinal: Negative.   Endocrine: Negative.   Genitourinary:  Negative.   Musculoskeletal: Positive for arthralgias and neck pain.  Skin: Negative.   Allergic/Immunologic: Positive for environmental allergies.  Neurological: Negative.   Hematological: Negative.   Psychiatric/Behavioral: Negative.     Last CBC Lab Results  Component Value Date   WBC 4.7 06/14/2019   HGB 13.9 06/14/2019   HCT 39.1 06/14/2019   MCV 88 06/14/2019   MCH  31.3 06/14/2019   RDW 11.8 06/14/2019   PLT 280 62/12/5595   Last metabolic panel Lab Results  Component Value Date   GLUCOSE 85 06/14/2019   NA 135 06/14/2019   K 3.3 (L) 06/14/2019   CL 92 (L) 06/14/2019   CO2 24 06/14/2019   BUN 8 06/14/2019   CREATININE 1.19 (H) 06/14/2019   GFRNONAA 52 (L) 06/14/2019   GFRAA 60 06/14/2019   CALCIUM 9.6 06/14/2019   PROT 6.4 06/14/2019   ALBUMIN 4.5 06/14/2019   LABGLOB 1.9 06/14/2019   AGRATIO 2.4 (H) 06/14/2019   BILITOT 0.4 06/14/2019   ALKPHOS 59 06/14/2019   AST 25 06/14/2019   ALT 11 06/14/2019   Last lipids Lab Results  Component Value Date   CHOL 188 06/14/2019   HDL 66 06/14/2019   LDLCALC 100 (H) 06/14/2019   TRIG 110 06/14/2019   CHOLHDL 2.8 06/14/2019      Objective    BP (!) 136/93 (BP Location: Left Arm, Patient Position: Sitting, Cuff Size: Normal)    Pulse 90    Temp 98.3 F (36.8 C) (Oral)    Resp 16    Ht 5' 4.5" (1.638 m)    Wt 136 lb 9.6 oz (62 kg)    BMI 23.09 kg/m  BP Readings from Last 3 Encounters:  06/16/20 (!) 136/93  12/10/19 140/90  07/12/19 (!) 156/93   Wt Readings from Last 3 Encounters:  06/16/20 136 lb 9.6 oz (62 kg)  12/10/19 156 lb 12.8 oz (71.1 kg)  07/12/19 150 lb 12.8 oz (68.4 kg)      Physical Exam Vitals reviewed.  Constitutional:      General: She is not in acute distress.    Appearance: Normal appearance. She is well-developed and normal weight. She is not ill-appearing or diaphoretic.  HENT:     Head: Normocephalic and atraumatic.     Right Ear: Tympanic membrane, ear canal and external ear  normal.     Left Ear: Tympanic membrane, ear canal and external ear normal.     Nose: Nose normal.     Mouth/Throat:     Mouth: Mucous membranes are moist.     Pharynx: Oropharynx is clear. No oropharyngeal exudate or posterior oropharyngeal erythema.  Eyes:     General: No scleral icterus.       Right eye: No discharge.        Left eye: No discharge.     Extraocular Movements: Extraocular movements intact.     Conjunctiva/sclera: Conjunctivae normal.     Pupils: Pupils are equal, round, and reactive to light.  Neck:     Thyroid: No thyromegaly.     Vascular: No carotid bruit or JVD.     Trachea: No tracheal deviation.  Cardiovascular:     Rate and Rhythm: Normal rate and regular rhythm.     Pulses: Normal pulses.     Heart sounds: Normal heart sounds. No murmur heard.  No friction rub. No gallop.   Pulmonary:     Effort: Pulmonary effort is normal. No respiratory distress.     Breath sounds: Normal breath sounds. No wheezing or rales.  Chest:     Chest wall: No tenderness.  Abdominal:     General: Abdomen is flat. Bowel sounds are normal. There is no distension.     Palpations: Abdomen is soft. There is no mass.     Tenderness: There is no abdominal tenderness. There is no guarding or rebound.  Musculoskeletal:  General: No tenderness. Normal range of motion.     Cervical back: Normal range of motion and neck supple.  Lymphadenopathy:     Cervical: No cervical adenopathy.  Skin:    General: Skin is warm and dry.     Capillary Refill: Capillary refill takes less than 2 seconds.     Findings: No rash.  Neurological:     General: No focal deficit present.     Mental Status: She is alert and oriented to person, place, and time. Mental status is at baseline.  Psychiatric:        Mood and Affect: Mood normal.        Behavior: Behavior normal.        Thought Content: Thought content normal.        Judgment: Judgment normal.       Last depression screening  scores PHQ 2/9 Scores 06/16/2020 06/16/2020 06/11/2019  PHQ - 2 Score 0 0 0  PHQ- 9 Score 0 0 -   Last fall risk screening Fall Risk  12/10/2019  Falls in the past year? 0  Number falls in past yr: 0  Injury with Fall? 0  Comment -  Follow up Falls evaluation completed   Last Audit-C alcohol use screening Alcohol Use Disorder Test (AUDIT) 06/16/2020  1. How often do you have a drink containing alcohol? 3  2. How many drinks containing alcohol do you have on a typical day when you are drinking? 0  3. How often do you have six or more drinks on one occasion? 0  AUDIT-C Score 3  Alcohol Brief Interventions/Follow-up AUDIT Score <7 follow-up not indicated   A score of 3 or more in women, and 4 or more in men indicates increased risk for alcohol abuse, EXCEPT if all of the points are from question 1   No results found for any visits on 06/16/20.  Assessment & Plan    Routine Health Maintenance and Physical Exam  Exercise Activities and Dietary recommendations Goals   None     Immunization History  Administered Date(s) Administered   Influenza,inj,Quad PF,6+ Mos 07/25/2017   Influenza-Unspecified 07/25/2017, 08/15/2018, 07/03/2019   Tdap 04/02/2016    Health Maintenance  Topic Date Due   INFLUENZA VACCINE  05/28/2020   MAMMOGRAM  07/18/2021   PAP SMEAR-Modifier  04/07/2023   COLONOSCOPY  05/07/2025   TETANUS/TDAP  04/02/2026   Hepatitis C Screening  Completed   HIV Screening  Completed    Discussed health benefits of physical activity, and encouraged her to engage in regular exercise appropriate for her age and condition.  1. Annual physical exam Normal physical exam today. Will check labs as below and f/u pending lab results. If labs are stable and WNL she will not need to have these rechecked for one year at her next annual physical exam. She is to call the office in the meantime if she has any acute issue, questions or concerns.  2. Encounter for breast  cancer screening using non-mammogram modality Breast exam today was normal. There is family history of breast cancer in her mother. She does perform regular self breast exams. Mammogram was ordered as below. Information for [facility] was given to patient so she may schedule her mammogram at her convenience. - MM 3D SCREEN BREAST BILATERAL; Future  3. Adult hypothyroidism Had been stable on Levothyroxine 16mcg. Will check labs as below and f/u pending results. - CBC w/Diff/Platelet - Comprehensive Metabolic Panel (CMET) - TSH  4. Borderline  diabetes Diet controlled. Has lost 20 pounds over last 5-6 months. Will check labs as below and f/u pending results. - CBC w/Diff/Platelet - Comprehensive Metabolic Panel (CMET) - Lipid Panel With LDL/HDL Ratio - HgB A1c  5. Avitaminosis D Postmenopausal. Will check labs as below and f/u pending results. - CBC w/Diff/Platelet - Comprehensive Metabolic Panel (CMET) - Vitamin D (25 hydroxy)  6. Pure hypercholesterolemia Diet controlled. Will check labs as below and f/u pending results. - CBC w/Diff/Platelet - Comprehensive Metabolic Panel (CMET) - Lipid Panel With LDL/HDL Ratio - HgB A1c  7. Essential hypertension Stable on Maxzide. Will check labs as below and f/u pending results. - CBC w/Diff/Platelet - Comprehensive Metabolic Panel (CMET) - Lipid Panel With LDL/HDL Ratio - HgB A1c - triamterene-hydrochlorothiazide (MAXZIDE-25) 37.5-25 MG tablet; Take 1 tablet by mouth daily.  Dispense: 90 tablet; Refill: 1   Return in about 1 year (around 06/16/2021).     Reynolds Bowl, PA-C, have reviewed all documentation for this visit. The documentation on 06/16/20 for the exam, diagnosis, procedures, and orders are all accurate and complete.   Rubye Beach  [facility] [phone] (phone) [phone] (fax)  [facility]

## 2020-06-16 ENCOUNTER — Other Ambulatory Visit: Payer: Self-pay

## 2020-06-16 ENCOUNTER — Encounter: Payer: Self-pay | Admitting: Physician Assistant

## 2020-06-16 ENCOUNTER — Ambulatory Visit (INDEPENDENT_AMBULATORY_CARE_PROVIDER_SITE_OTHER): Payer: BC Managed Care – PPO | Admitting: Physician Assistant

## 2020-06-16 VITALS — BP 136/93 | HR 90 | Temp 98.3°F | Resp 16 | Ht 64.5 in | Wt 136.6 lb

## 2020-06-16 DIAGNOSIS — Z0000 Encounter for general adult medical examination without abnormal findings: Secondary | ICD-10-CM

## 2020-06-16 DIAGNOSIS — R7303 Prediabetes: Secondary | ICD-10-CM | POA: Diagnosis not present

## 2020-06-16 DIAGNOSIS — E039 Hypothyroidism, unspecified: Secondary | ICD-10-CM | POA: Diagnosis not present

## 2020-06-16 DIAGNOSIS — E559 Vitamin D deficiency, unspecified: Secondary | ICD-10-CM

## 2020-06-16 DIAGNOSIS — Z1239 Encounter for other screening for malignant neoplasm of breast: Secondary | ICD-10-CM

## 2020-06-16 DIAGNOSIS — E7800 Pure hypercholesterolemia, unspecified: Secondary | ICD-10-CM

## 2020-06-16 DIAGNOSIS — I10 Essential (primary) hypertension: Secondary | ICD-10-CM

## 2020-06-16 MED ORDER — TRIAMTERENE-HCTZ 37.5-25 MG PO TABS: 1.0000 | ORAL_TABLET | Freq: Every day | ORAL | 1 refills | Status: DC

## 2020-06-16 NOTE — Patient Instructions (Signed)
[facility] [place],  [place]  [address] [address]: [phone]   Health Maintenance for Postmenopausal Women Menopause is a normal process in which your ability to get pregnant comes to an end. This process happens slowly over many months or years, usually between the ages of 66 and 34. Menopause is complete when you have missed your menstrual periods for 12 months. It is important to talk with your health care provider about some of the most common conditions that affect women after menopause (postmenopausal women). These include heart disease, cancer, and bone loss (osteoporosis). Adopting a healthy lifestyle and getting preventive care can help to promote your health and wellness. The actions you take can also lower your chances of developing some of these common conditions. What should I know about menopause? During menopause, you may get a number of symptoms, such as:  Hot flashes. These can be moderate or severe.  Night sweats.  Decrease in sex drive.  Mood swings.  Headaches.  Tiredness.  Irritability.  Memory problems.  Insomnia. Choosing to treat or not to treat these symptoms is a decision that you make with your health care provider. Do I need hormone replacement therapy?  Hormone replacement therapy is effective in treating symptoms that are caused by menopause, such as hot flashes and night sweats.  Hormone replacement carries certain risks, especially as you become older. If you are thinking about using estrogen or estrogen with progestin, discuss the benefits and risks with your health care provider. What is my risk for heart disease and stroke? The risk of heart disease, heart attack, and stroke increases as you age. One of the causes may be a change in the body's hormones during menopause. This can affect how your body uses dietary fats, triglycerides, and cholesterol. Heart attack and stroke are medical  emergencies. There are many things that you can do to help prevent heart disease and stroke. Watch your blood pressure  High blood pressure causes heart disease and increases the risk of stroke. This is more likely to develop in people who have high blood pressure readings, are of African descent, or are overweight.  Have your blood pressure checked: ? Every 3-5 years if you are 73-73 years of age. ? Every year if you are 67 years old or older. Eat a healthy diet   Eat a diet that includes plenty of vegetables, fruits, low-fat dairy products, and lean protein.  Do not eat a lot of foods that are high in solid fats, added sugars, or sodium. Get regular exercise Get regular exercise. This is one of the most important things you can do for your health. Most adults should:  Try to exercise for at least 150 minutes each week. The exercise should increase your heart rate and make you sweat (moderate-intensity exercise).  Try to do strengthening exercises at least twice each week. Do these in addition to the moderate-intensity exercise.  Spend less time sitting. Even light physical activity can be beneficial. Other tips  Work with your health care provider to achieve or maintain a healthy weight.  Do not use any products that contain nicotine or tobacco, such as cigarettes, e-cigarettes, and chewing tobacco. If you need help quitting, ask your health care provider.  Know your numbers. Ask your health care provider to check your cholesterol and your blood sugar (glucose). Continue to have your blood tested as directed by your health care provider. Do I need screening for cancer? Depending on  your health history and family history, you may need to have cancer screening at different stages of your life. This may include screening for:  Breast cancer.  Cervical cancer.  Lung cancer.  Colorectal cancer. What is my risk for osteoporosis? After menopause, you may be at increased risk for  osteoporosis. Osteoporosis is a condition in which bone destruction happens more quickly than new bone creation. To help prevent osteoporosis or the bone fractures that can happen because of osteoporosis, you may take the following actions:  If you are 20-35 years old, get at least 1,000 mg of calcium and at least 600 mg of vitamin D per day.  If you are older than age 32 but younger than age 45, get at least 1,200 mg of calcium and at least 600 mg of vitamin D per day.  If you are older than age 44, get at least 1,200 mg of calcium and at least 800 mg of vitamin D per day. Smoking and drinking excessive alcohol increase the risk of osteoporosis. Eat foods that are rich in calcium and vitamin D, and do weight-bearing exercises several times each week as directed by your health care provider. How does menopause affect my mental health? Depression may occur at any age, but it is more common as you become older. Common symptoms of depression include:  Low or sad mood.  Changes in sleep patterns.  Changes in appetite or eating patterns.  Feeling an overall lack of motivation or enjoyment of activities that you previously enjoyed.  Frequent crying spells. Talk with your health care provider if you think that you are experiencing depression. General instructions See your health care provider for regular wellness exams and vaccines. This may include:  Scheduling regular health, dental, and eye exams.  Getting and maintaining your vaccines. These include: ? Influenza vaccine. Get this vaccine each year before the flu season begins. ? Pneumonia vaccine. ? Shingles vaccine. ? Tetanus, diphtheria, and pertussis (Tdap) booster vaccine. Your health care provider may also recommend other immunizations. Tell your health care provider if you have ever been abused or do not feel safe at home. Summary  Menopause is a normal process in which your ability to get pregnant comes to an end.  This  condition causes hot flashes, night sweats, decreased interest in sex, mood swings, headaches, or lack of sleep.  Treatment for this condition may include hormone replacement therapy.  Take actions to keep yourself healthy, including exercising regularly, eating a healthy diet, watching your weight, and checking your blood pressure and blood sugar levels.  Get screened for cancer and depression. Make sure that you are up to date with all your vaccines. This information is not intended to replace advice given to you by your health care provider. Make sure you discuss any questions you have with your health care provider. Document Revised: 10/07/2018 Document Reviewed: 10/07/2018 Elsevier Patient Education  2020 [employer].

## 2020-06-17 LAB — CBC WITH DIFFERENTIAL/PLATELET
Basophils Absolute: 0.1 10*3/uL (ref 0.0–0.2)
Basos: 1 %
EOS (ABSOLUTE): 0.2 10*3/uL (ref 0.0–0.4)
Eos: 2 %
Hematocrit: 41.3 % (ref 34.0–46.6)
Hemoglobin: 14.6 g/dL (ref 11.1–15.9)
Immature Grans (Abs): 0 10*3/uL (ref 0.0–0.1)
Immature Granulocytes: 0 %
Lymphocytes Absolute: 1.5 10*3/uL (ref 0.7–3.1)
Lymphs: 24 %
MCH: 31.9 pg (ref 26.6–33.0)
MCHC: 35.4 g/dL (ref 31.5–35.7)
MCV: 90 fL (ref 79–97)
Monocytes Absolute: 0.5 10*3/uL (ref 0.1–0.9)
Monocytes: 7 %
Neutrophils Absolute: 4.2 10*3/uL (ref 1.4–7.0)
Neutrophils: 66 %
Platelets: 298 10*3/uL (ref 150–450)
RBC: 4.57 x10E6/uL (ref 3.77–5.28)
RDW: 11.9 % (ref 11.7–15.4)
WBC: 6.4 10*3/uL (ref 3.4–10.8)

## 2020-06-17 LAB — LIPID PANEL WITH LDL/HDL RATIO
Cholesterol, Total: 249 mg/dL — ABNORMAL HIGH (ref 100–199)
HDL: 96 mg/dL (ref >39)
LDL Chol Calc (NIH): 135 mg/dL — ABNORMAL HIGH (ref 0–99)
LDL/HDL Ratio: 1.4 ratio (ref 0.0–3.2)
Triglycerides: 108 mg/dL (ref 0–149)
VLDL Cholesterol Cal: 18 mg/dL (ref 5–40)

## 2020-06-17 LAB — COMPREHENSIVE METABOLIC PANEL
ALT: 22 IU/L (ref 0–32)
AST: 26 IU/L (ref 0–40)
Albumin/Globulin Ratio: 2.1 (ref 1.2–2.2)
Albumin: 4.9 g/dL (ref 3.8–4.9)
Alkaline Phosphatase: 102 IU/L (ref 48–121)
BUN/Creatinine Ratio: 13 (ref 9–23)
BUN: 13 mg/dL (ref 6–24)
Bilirubin Total: 0.4 mg/dL (ref 0.0–1.2)
CO2: 27 mmol/L (ref 20–29)
Calcium: 10.7 mg/dL — ABNORMAL HIGH (ref 8.7–10.2)
Chloride: 91 mmol/L — ABNORMAL LOW (ref 96–106)
Creatinine, Ser: 1 mg/dL (ref 0.57–1.00)
GFR calc Af Amer: 73 mL/min/{1.73_m2} (ref >59)
GFR calc non Af Amer: 64 mL/min/{1.73_m2} (ref >59)
Globulin, Total: 2.3 g/dL (ref 1.5–4.5)
Glucose: 89 mg/dL (ref 65–99)
Potassium: 3.1 mmol/L — ABNORMAL LOW (ref 3.5–5.2)
Sodium: 137 mmol/L (ref 134–144)
Total Protein: 7.2 g/dL (ref 6.0–8.5)

## 2020-06-17 LAB — HEMOGLOBIN A1C
Est. average glucose Bld gHb Est-mCnc: 111 mg/dL
Hgb A1c MFr Bld: 5.5 % (ref 4.8–5.6)

## 2020-06-17 LAB — TSH: TSH: 2.19 u[IU]/mL (ref 0.450–4.500)

## 2020-06-17 LAB — VITAMIN D 25 HYDROXY (VIT D DEFICIENCY, FRACTURES): Vit D, 25-Hydroxy: 25.3 ng/mL — ABNORMAL LOW (ref 30.0–100.0)

## 2020-06-19 ENCOUNTER — Telehealth: Payer: Self-pay

## 2020-06-19 NOTE — Telephone Encounter (Signed)
-----   Message from Mar Daring, [place] sent at 06/19/2020  7:27 AM EDT ----- Blood count is normal. Kidney and liver function are normal. Sodium is normal. Potassium is low. Can try to increase potassium rich foods in diet such as leafy greens, broccoli, sweet potatoes and bananas. Calcium is also borderline elevated. Would recommend to decrease calcium supplement to maybe once or twice per week. We can recheck calcium and potassium in 4 weeks. Thyroid is normal. Cholesterol has increased compared to last year. Currently your risk of having a cardiovascular event over the next 10 years is low at 2.2% due to you having an elevated HDL (good) cholesterol. This is cardioprotective. Continue working with healthy lifestyle modifications. Vit D is borderline low. Recommend to take a separate OTC Vit D supplement of 1000-2000 IU daily.

## 2020-06-19 NOTE — Telephone Encounter (Signed)
Written by Mar Daring, PA-C on 06/19/2020 7:27 AM EDT  Seen by patient Erika May on 06/19/2020 7:29 AM

## 2020-08-06 ENCOUNTER — Other Ambulatory Visit: Payer: Self-pay | Admitting: Physician Assistant

## 2020-08-06 DIAGNOSIS — E039 Hypothyroidism, unspecified: Secondary | ICD-10-CM

## 2020-09-05 ENCOUNTER — Encounter: Payer: Self-pay | Admitting: Physician Assistant

## 2020-09-05 DIAGNOSIS — N959 Unspecified menopausal and perimenopausal disorder: Secondary | ICD-10-CM

## 2020-09-06 MED ORDER — PAROXETINE HCL 10 MG PO TABS: 10.0000 mg | ORAL_TABLET | Freq: Every day | ORAL | 0 refills | Status: DC

## 2020-09-06 MED ORDER — ESTRADIOL 0.1 MG/GM VA CREA: 1.0000 | TOPICAL_CREAM | VAGINAL | 12 refills | Status: DC

## 2020-09-06 NOTE — Addendum Note (Signed)
Addended by: Mar Daring on: 09/06/2020 02:48 PM   Modules accepted: Orders

## 2020-09-27 ENCOUNTER — Ambulatory Visit
Admission: RE | Admit: 2020-09-27 | Discharge: 2020-09-27 | Disposition: A | Discharge status: Home / Self Care | Payer: BC Managed Care – PPO | Source: Ambulatory Visit | Attending: Physician Assistant | Admitting: Physician Assistant

## 2020-09-27 ENCOUNTER — Other Ambulatory Visit: Payer: Self-pay

## 2020-09-27 DIAGNOSIS — Z1239 Encounter for other screening for malignant neoplasm of breast: Secondary | ICD-10-CM

## 2020-09-27 DIAGNOSIS — Z1231 Encounter for screening mammogram for malignant neoplasm of breast: Secondary | ICD-10-CM | POA: Diagnosis not present

## 2020-10-06 NOTE — Progress Notes (Signed)
MyChart Video Visit    Virtual Visit via Video Note   This visit type was conducted due to national recommendations for restrictions regarding the COVID-19 Pandemic (e.g. social distancing) in an effort to limit this patient's exposure and mitigate transmission in our community. This patient is at least at moderate risk for complications without adequate follow up. This format is felt to be most appropriate for this patient at this time. Physical exam was limited by quality of the video and audio technology used for the visit.   Interactive audio and video communications were attempted, although failed due to patient's inability to connect to video. Continued visit with audio only interaction with patient agreement.  Patient location: Home Provider location: [facility]  I discussed the limitations of evaluation and management by telemedicine and the availability of in person appointments. The patient expressed understanding and agreed to proceed.  Patient: Erika May   DOB: [birth year]   54 y.o. Female  MRN: [id number] Visit Date: 10/09/2020  Today's healthcare provider: Mar Daring, PA-C   No chief complaint on file.  Subjective    HPI  Follow up for hot flashes and vaginal symptoms  The patient was last seen for this 4-6 weeks ago. Changes made at last visit include start paroxetine and topical estrogen.  She reports excellent compliance with treatment. She feels that condition is Improved. She is not having side effects.   -----------------------------------------------------------------------------------------   Patient Active Problem List   Diagnosis Date Noted   Essential hypertension 06/16/2020   Genetic testing 03/13/2020   Family history of breast cancer    Family history of melanoma    Family history of colon cancer    Family history of prostate cancer    BMI 26.0-26.9,adult 04/03/2017   HLD (hyperlipidemia) 03/13/2016   Family  planning 03/13/2016   Benign neoplasm of skin 03/13/2016   Blood pressure elevated 03/13/2016   Symptomatic menopausal or female climacteric states 03/13/2016   Borderline diabetes 03/13/2016   Contact dermatitis due to Genus Toxicodendron 03/13/2016   Avitaminosis D 03/13/2016   Adult hypothyroidism 10/09/2015   First degree hemorrhoids    Fibroid 04/15/2008   Allergic rhinitis 08/10/2007   Narrowing of intervertebral disc space 08/10/2007   Past Medical History:  Diagnosis Date   Allergic dermatitis due to poison ivy    Broken neck ([place])    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   Calcium Carb-Cholecalciferol (CALCIUM + D3) 600-200 MG-UNIT TABS Take by mouth 3 (three) times a week.    calcium carbonate (TUMS - DOSED IN MG ELEMENTAL CALCIUM) 500 MG chewable tablet Chew 1 tablet by mouth as needed for indigestion or heartburn.   estradiol (ESTRACE VAGINAL) 0.1 MG/GM vaginal cream Place 1 Applicatorful vaginally 3 (three) times a week.   EUTHYROX 50 MCG tablet TAKE 1  TABLET BY MOUTH ONCE DAILY BEFORE BREAKFAST   ibuprofen (ADVIL,MOTRIN) 200 MG tablet Take 200 mg by mouth every 6 (six) hours as needed for headache.    loratadine (CLARITIN) 10 MG tablet Take 10 mg by mouth daily. am   Multiple Vitamin (MULTIVITAMIN) capsule Take 1 capsule by mouth daily. am   PARoxetine (PAXIL) 10 MG tablet Take 1 tablet (10  mg total) by mouth daily.   phentermine (ADIPEX-P) 37.5 MG tablet Take 1 tablet (37.5 mg total) by mouth daily before breakfast.   Sennosides-Docusate Sodium (PERI-COLACE PO) Take by mouth as needed.   triamterene-hydrochlorothiazide (MAXZIDE-25) 37.5-25 MG tablet Take 1 tablet by mouth daily.   Turmeric (QC TUMERIC COMPLEX) 500 MG CAPS Take by mouth.   No facility-administered medications prior to visit.    Review of Systems  Constitutional: Negative.   Respiratory: Negative.   Cardiovascular: Negative.   Genitourinary: Negative.   Neurological: Negative.     Last CBC Lab Results  Component Value Date   WBC 6.4 06/16/2020   HGB 14.6 06/16/2020   HCT 41.3 06/16/2020   MCV 90 06/16/2020   MCH 31.9 06/16/2020   RDW 11.9 06/16/2020   PLT 298 42/87/6811   Last metabolic panel Lab Results  Component Value Date   GLUCOSE 89 06/16/2020   NA 137 06/16/2020   K 3.1 (L) 06/16/2020   CL 91 (L) 06/16/2020   CO2 27 06/16/2020   BUN 13 06/16/2020   CREATININE 1.00 06/16/2020   GFRNONAA 64 06/16/2020   GFRAA 73 06/16/2020   CALCIUM 10.7 (H) 06/16/2020   PROT 7.2 06/16/2020   ALBUMIN 4.9 06/16/2020   LABGLOB 2.3 06/16/2020   AGRATIO 2.1 06/16/2020   BILITOT 0.4 06/16/2020   ALKPHOS 102 06/16/2020   AST 26 06/16/2020   ALT 22 06/16/2020      Objective    There were no vitals taken for this visit. BP Readings from Last 3 Encounters:  06/16/20 (!) 136/93  12/10/19 140/90  07/12/19 (!) 156/93   Wt Readings from Last 3 Encounters:  06/16/20 136 lb 9.6 oz (62 kg)  12/10/19 156 lb 12.8 oz (71.1 kg)  07/12/19 150 lb 12.8 oz (68.4 kg)      Physical Exam     Assessment & Plan     1. Menopausal disorder Improved. Continue paroxetine and topical estrace. Call if breakthrough symptoms occur. - PARoxetine (PAXIL) 10 MG tablet; Take 1 tablet (10 mg total) by mouth daily.  Dispense: 90 tablet; Refill: 1  Of Note: patient did mention falling last Thursday carrying her  senior dog and may have broken some ribs. We discussed imaging, and decided to just monitor for SOB or difficulty breathing.   No follow-ups on file.     I discussed the assessment and treatment plan with the patient. The patient was provided an opportunity to ask questions and all were answered. The patient agreed with the plan and demonstrated an understanding of the instructions.   The patient was advised to call back or seek an in-person evaluation if the symptoms worsen or if the condition fails to improve as anticipated.  I provided 11 minutes of non-face-to-face time during this encounter.  Reynolds Bowl, PA-C, have reviewed all documentation for this visit. The documentation on 10/09/20 for the exam, diagnosis, procedures, and orders are all accurate and complete.  [place] [facility] [phone] (phone) [phone] (fax)  [employer]

## 2020-10-09 ENCOUNTER — Encounter: Payer: Self-pay | Admitting: Physician Assistant

## 2020-10-09 ENCOUNTER — Telehealth (INDEPENDENT_AMBULATORY_CARE_PROVIDER_SITE_OTHER): Payer: BC Managed Care – PPO | Admitting: Physician Assistant

## 2020-10-09 DIAGNOSIS — N959 Unspecified menopausal and perimenopausal disorder: Secondary | ICD-10-CM

## 2020-10-09 MED ORDER — PAROXETINE HCL 10 MG PO TABS: 10.0000 mg | ORAL_TABLET | Freq: Every day | ORAL | 1 refills | Status: DC

## 2020-10-09 NOTE — Patient Instructions (Signed)
Menopause Menopause is the normal time of life when menstrual periods stop completely. It is usually confirmed by 12 months without a menstrual period. The transition to menopause (perimenopause) most often happens between the ages of 45 and 55. During perimenopause, hormone levels change in your body, which can cause symptoms and affect your health. Menopause may increase your risk for:  Loss of bone (osteoporosis), which causes bone breaks (fractures).  Depression.  Hardening and narrowing of the arteries (atherosclerosis), which can cause heart attacks and strokes. What are the causes? This condition is usually caused by a natural change in hormone levels that happens as you get older. The condition may also be caused by surgery to remove both ovaries (bilateral oophorectomy). What increases the risk? This condition is more likely to start at an earlier age if you have certain medical conditions or treatments, including:  A tumor of the pituitary gland in the brain.  A disease that affects the ovaries and hormone production.  Radiation treatment for cancer.  Certain cancer treatments, such as chemotherapy or hormone (anti-estrogen) therapy.  Heavy smoking and excessive alcohol use.  Family history of early menopause. This condition is also more likely to develop earlier in women who are very thin. What are the signs or symptoms? Symptoms of this condition include:  Hot flashes.  Irregular menstrual periods.  Night sweats.  Changes in feelings about sex. This could be a decrease in sex drive or an increased comfort around your sexuality.  Vaginal dryness and thinning of the vaginal walls. This may cause painful intercourse.  Dryness of the skin and development of wrinkles.  Headaches.  Problems sleeping (insomnia).  Mood swings or irritability.  Memory problems.  Weight gain.  Hair growth on the face and chest.  Bladder infections or problems with urinating. How  is this diagnosed? This condition is diagnosed based on your medical history, a physical exam, your age, your menstrual history, and your symptoms. Hormone tests may also be done. How is this treated? In some cases, no treatment is needed. You and your health care provider should make a decision together about whether treatment is necessary. Treatment will be based on your individual condition and preferences. Treatment for this condition focuses on managing symptoms. Treatment may include:  Menopausal hormone therapy (MHT).  Medicines to treat specific symptoms or complications.  Acupuncture.  Vitamin or herbal supplements. Before starting treatment, make sure to let your health care provider know if you have a personal or family history of:  Heart disease.  Breast cancer.  Blood clots.  Diabetes.  Osteoporosis. Follow these instructions at home: Lifestyle  Do not use any products that contain nicotine or tobacco, such as cigarettes and e-cigarettes. If you need help quitting, ask your health care provider.  Get at least 30 minutes of physical activity on 5 or more days each week.  Avoid alcoholic and caffeinated beverages, as well as spicy foods. This may help prevent hot flashes.  Get 7-8 hours of sleep each night.  If you have hot flashes, try: ? Dressing in layers. ? Avoiding things that may trigger hot flashes, such as spicy food, warm places, or stress. ? Taking slow, deep breaths when a hot flash starts. ? Keeping a fan in your home and office.  Find ways to manage stress, such as deep breathing, meditation, or journaling.  Consider going to group therapy with other women who are having menopause symptoms. Ask your health care provider about recommended group therapy meetings. Eating and   drinking  Eat a healthy, balanced diet that contains whole grains, lean protein, low-fat dairy, and plenty of fruits and vegetables.  Your health care provider may recommend  adding more soy to your diet. Foods that contain soy include tofu, tempeh, and soy milk.  Eat plenty of foods that contain calcium and vitamin D for bone health. Items that are rich in calcium include low-fat milk, yogurt, beans, almonds, sardines, broccoli, and kale. Medicines  Take over-the-counter and prescription medicines only as told by your health care provider.  Talk with your health care provider before starting any herbal supplements. If prescribed, take vitamins and supplements as told by your health care provider. These may include: ? Calcium. Women age 51 and older should get 1,200 mg (milligrams) of calcium every day. ? Vitamin D. Women need 600-800 International Units of vitamin D each day. ? Vitamins B12 and B6. Aim for 50 micrograms of B12 and 1.5 mg of B6 each day. General instructions  Keep track of your menstrual periods, including: ? When they occur. ? How heavy they are and how long they last. ? How much time passes between periods.  Keep track of your symptoms, noting when they start, how often you have them, and how long they last.  Use vaginal lubricants or moisturizers to help with vaginal dryness and improve comfort during sex.  Keep all follow-up visits as told by your health care provider. This is important. This includes any group therapy or counseling. Contact a health care provider if:  You are still having menstrual periods after age 55.  You have pain during sex.  You have not had a period for 12 months and you develop vaginal bleeding. Get help right away if:  You have: ? Severe depression. ? Excessive vaginal bleeding. ? Pain when you urinate. ? A fast or irregular heart beat (palpitations). ? Severe headaches. ? Abdomen (abdominal) pain or severe indigestion.  You fell and you think you have a broken bone.  You develop leg or chest pain.  You develop vision problems.  You feel a lump in your breast. Summary  Menopause is the normal  time of life when menstrual periods stop completely. It is usually confirmed by 12 months without a menstrual period.  The transition to menopause (perimenopause) most often happens between the ages of 45 and 55.  Symptoms can be managed through medicines, lifestyle changes, and complementary therapies such as acupuncture.  Eat a balanced diet that is rich in nutrients to promote bone health and heart health and to manage symptoms during menopause. This information is not intended to replace advice given to you by your health care provider. Make sure you discuss any questions you have with your health care provider. Document Revised: 09/26/2017 Document Reviewed: 11/16/2016 Elsevier Patient Education  2020 Elsevier Inc.  

## 2020-11-10 ENCOUNTER — Other Ambulatory Visit: Payer: Self-pay | Admitting: Physician Assistant

## 2020-11-10 DIAGNOSIS — E039 Hypothyroidism, unspecified: Secondary | ICD-10-CM

## 2020-12-30 ENCOUNTER — Other Ambulatory Visit: Payer: Self-pay | Admitting: Physician Assistant

## 2020-12-30 DIAGNOSIS — I10 Essential (primary) hypertension: Secondary | ICD-10-CM

## 2020-12-30 NOTE — Telephone Encounter (Signed)
Requested Prescriptions  Pending Prescriptions Disp Refills  . triamterene-hydrochlorothiazide (MAXZIDE-25) 37.5-25 MG tablet [Pharmacy Med Name: Triamterene-HCTZ 37.5-25 MG Oral Tablet] 90 tablet 0    Sig: Take 1 tablet by mouth once daily     Cardiovascular: Diuretic Combos Failed - 12/30/2020  8:27 AM      Failed - K in normal range and within 360 days    Potassium  Date Value Ref Range Status  06/16/2020 3.1 (L) 3.5 - 5.2 mmol/L Final         Failed - Ca in normal range and within 360 days    Calcium  Date Value Ref Range Status  06/16/2020 10.7 (H) 8.7 - 10.2 mg/dL Final         Failed - Last BP in normal range    BP Readings from Last 1 Encounters:  06/16/20 (!) 136/93         Passed - Na in normal range and within 360 days    Sodium  Date Value Ref Range Status  06/16/2020 137 134 - 144 mmol/L Final         Passed - Cr in normal range and within 360 days    Creatinine, Ser  Date Value Ref Range Status  06/16/2020 1.00 0.57 - 1.00 mg/dL Final         Passed - Valid encounter within last 6 months    Recent Outpatient Visits          2 months ago Menopausal disorder   [facility] [place], Clearnce Sorrel, PA-C   6 months ago Annual physical exam   [place], Clearnce Sorrel, [place]   1 year ago Essential hypertension   [place], Clearnce Sorrel, [place]   1 year ago Rotator cuff impingement syndrome of right shoulder   [place], Clearnce Sorrel, [place]   1 year ago Annual physical exam   [facility] [place], Clearnce Sorrel, [place]      Future Appointments            In 5 months [place], Clearnce Sorrel, PA-C [employer], [place]

## 2021-01-02 ENCOUNTER — Encounter: Payer: Self-pay | Admitting: Physician Assistant

## 2021-01-02 DIAGNOSIS — E663 Overweight: Secondary | ICD-10-CM

## 2021-01-04 MED ORDER — PHENTERMINE HCL 37.5 MG PO TABS: 37.5000 mg | ORAL_TABLET | Freq: Every day | ORAL | 2 refills | Status: DC

## 2021-02-09 ENCOUNTER — Other Ambulatory Visit: Payer: Self-pay | Admitting: Physician Assistant

## 2021-02-09 DIAGNOSIS — E039 Hypothyroidism, unspecified: Secondary | ICD-10-CM

## 2021-03-27 ENCOUNTER — Other Ambulatory Visit: Payer: Self-pay | Admitting: Physician Assistant

## 2021-03-27 DIAGNOSIS — I10 Essential (primary) hypertension: Secondary | ICD-10-CM

## 2021-04-02 NOTE — Telephone Encounter (Signed)
[place] faxed refill request for the following medications:  triamterene-hydrochlorothiazide (MAXZIDE-25) 37.5-25 MG tablet  Please advise. Thanks TNP

## 2021-04-10 ENCOUNTER — Other Ambulatory Visit: Payer: Self-pay | Admitting: Physician Assistant

## 2021-04-10 DIAGNOSIS — N959 Unspecified menopausal and perimenopausal disorder: Secondary | ICD-10-CM

## 2021-04-13 ENCOUNTER — Encounter: Payer: Self-pay | Admitting: Physician Assistant

## 2021-04-13 DIAGNOSIS — N959 Unspecified menopausal and perimenopausal disorder: Secondary | ICD-10-CM

## 2021-04-17 NOTE — Telephone Encounter (Signed)
Please send in new Rx for Paxil 20 mg daily #90 r0. Make sure she follows up at least q44m.

## 2021-04-19 MED ORDER — PAROXETINE HCL 20 MG PO TABS: 10.0000 mg | ORAL_TABLET | Freq: Every day | ORAL | 0 refills | Status: DC

## 2021-05-20 ENCOUNTER — Other Ambulatory Visit: Payer: Self-pay | Admitting: Family Medicine

## 2021-05-20 DIAGNOSIS — E039 Hypothyroidism, unspecified: Secondary | ICD-10-CM

## 2021-05-20 NOTE — Telephone Encounter (Signed)
Requested Prescriptions  Pending Prescriptions Disp Refills  . levothyroxine (SYNTHROID) 50 MCG tablet [Pharmacy Med Name: Levothyroxine Sodium 50 MCG Oral Tablet] 30 tablet 0    Sig: TAKE 1 TABLET BY MOUTH ONCE DAILY BEFORE BREAKFAST     Endocrinology:  Hypothyroid Agents Failed - 05/20/2021 10:56 AM      Failed - TSH needs to be rechecked within 3 months after an abnormal result. Refill until TSH is due.      Passed - TSH in normal range and within 360 days    TSH  Date Value Ref Range Status  06/16/2020 2.190 0.450 - 4.500 uIU/mL Final         Passed - Valid encounter within last 12 months    Recent Outpatient Visits          7 months ago Menopausal disorder   [facility] Mar Daring, [place]   11 months ago Annual physical exam   [facility] [place], Clearnce Sorrel, [place]   1 year ago Essential hypertension   [place], Clearnce Sorrel, [place]   1 year ago Rotator cuff impingement syndrome of right shoulder   [facility] [place], Clearnce Sorrel, [place]   1 year ago Annual physical exam   [place], Clearnce Sorrel, [place]      Future Appointments            In 4 weeks Gwyneth Sprout, [place], [facility]

## 2021-06-18 ENCOUNTER — Ambulatory Visit (INDEPENDENT_AMBULATORY_CARE_PROVIDER_SITE_OTHER): Payer: BC Managed Care – PPO | Admitting: Family Medicine

## 2021-06-18 ENCOUNTER — Other Ambulatory Visit: Payer: Self-pay

## 2021-06-18 ENCOUNTER — Encounter: Payer: Self-pay | Admitting: Physician Assistant

## 2021-06-18 ENCOUNTER — Encounter: Payer: Self-pay | Admitting: Family Medicine

## 2021-06-18 VITALS — BP 140/85 | HR 71 | Temp 98.5°F | Resp 15 | Ht 64.5 in | Wt 157.2 lb

## 2021-06-18 DIAGNOSIS — R7303 Prediabetes: Secondary | ICD-10-CM | POA: Diagnosis not present

## 2021-06-18 DIAGNOSIS — Z1231 Encounter for screening mammogram for malignant neoplasm of breast: Secondary | ICD-10-CM

## 2021-06-18 DIAGNOSIS — I10 Essential (primary) hypertension: Secondary | ICD-10-CM | POA: Diagnosis not present

## 2021-06-18 DIAGNOSIS — Z6826 Body mass index (BMI) 26.0-26.9, adult: Secondary | ICD-10-CM

## 2021-06-18 DIAGNOSIS — N959 Unspecified menopausal and perimenopausal disorder: Secondary | ICD-10-CM

## 2021-06-18 DIAGNOSIS — E559 Vitamin D deficiency, unspecified: Secondary | ICD-10-CM

## 2021-06-18 DIAGNOSIS — E039 Hypothyroidism, unspecified: Secondary | ICD-10-CM

## 2021-06-18 DIAGNOSIS — E7800 Pure hypercholesterolemia, unspecified: Secondary | ICD-10-CM | POA: Diagnosis not present

## 2021-06-18 DIAGNOSIS — Z0000 Encounter for general adult medical examination without abnormal findings: Secondary | ICD-10-CM | POA: Diagnosis not present

## 2021-06-18 DIAGNOSIS — J302 Other seasonal allergic rhinitis: Secondary | ICD-10-CM

## 2021-06-18 DIAGNOSIS — E663 Overweight: Secondary | ICD-10-CM

## 2021-06-18 DIAGNOSIS — N951 Menopausal and female climacteric states: Secondary | ICD-10-CM

## 2021-06-18 MED ORDER — ESTRADIOL 0.1 MG/GM VA CREA
1.0000 | TOPICAL_CREAM | VAGINAL | 12 refills | Status: DC
Start: 2021-06-18 — End: 2022-12-20

## 2021-06-18 MED ORDER — TRIAMTERENE-HCTZ 37.5-25 MG PO TABS: 1.0000 | ORAL_TABLET | Freq: Every day | ORAL | 3 refills | Status: DC

## 2021-06-18 MED ORDER — PHENTERMINE HCL 37.5 MG PO TABS: 37.5000 mg | ORAL_TABLET | Freq: Every day | ORAL | 1 refills | Status: DC

## 2021-06-18 MED ORDER — PAROXETINE HCL 20 MG PO TABS: 20.0000 mg | ORAL_TABLET | Freq: Every day | ORAL | 3 refills | Status: DC

## 2021-06-18 MED ORDER — LEVOTHYROXINE SODIUM 50 MCG PO TABS: 50.0000 ug | ORAL_TABLET | Freq: Every day | ORAL | 3 refills | Status: DC

## 2021-06-18 NOTE — Assessment & Plan Note (Signed)
Reinforced diet and exercise Labs completed

## 2021-06-18 NOTE — Assessment & Plan Note (Signed)
Complaints of sleep difficulties d/t hot flashes Slight improvement since start paxil and estrogen cream

## 2021-06-18 NOTE — Assessment & Plan Note (Signed)
Pulled for lab draw Denies complaints

## 2021-06-18 NOTE — Assessment & Plan Note (Signed)
Recommend use of Zyrtec and Flonase Complaints of PND Evidence seen by erythema in back of throat Pt report of worsening symptoms following outside exposure

## 2021-06-18 NOTE — Assessment & Plan Note (Signed)
Stable at this time Complaints worse at night Using paxil and estrogen cream to assist with s/s Refills sent- continue to monitor/manage

## 2021-06-18 NOTE — Assessment & Plan Note (Signed)
Is checking at home 120s/70s per pt report Recommend continue to trend at home Refill sent for medication Denies CP, SOB, DOE, or edema

## 2021-06-18 NOTE — Assessment & Plan Note (Addendum)
Discussed healthy weight and weight reduction to assist with management of chronic dz  Restart phen once daily, am- pt previously taking in May 22

## 2021-06-18 NOTE — Assessment & Plan Note (Signed)
Reinforced healthy weight A1c done today Recommend small, freq meals- balanced snacks- water to drink

## 2021-06-18 NOTE — Assessment & Plan Note (Signed)
Management of chronic condition No acute complaints Plan for mammo UTD on PAP and [place] Recc vaccinations for shingles and flu

## 2021-06-18 NOTE — Assessment & Plan Note (Signed)
Chronic condition Denies complaints TSH testing today Repeat lab work

## 2021-06-18 NOTE — Assessment & Plan Note (Signed)
Due for yearly screen in 09/2021 Denies complaints Discussed 'know your lemon' campaign

## 2021-06-18 NOTE — Progress Notes (Signed)
Complete physical exam   Patient: Erika May   DOB: 1966-04-30   55 y.o. Female  MRN: [id number] Visit Date: 06/18/2021  Today's healthcare provider: Gwyneth Sprout, FNP   Chief Complaint  Patient presents with   Annual Exam   Subjective    Erika May is a 55 y.o. female who presents today for a complete physical exam.   HPI  She reports consuming a general diet. Patient reports that she is actively exercising 5x a week She generally feels well. She reports sleeping well. She does not have additional problems to discuss today.   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/ experiences stiffness   DDD (degenerative disc disease), lumbar    Family history of breast cancer    Family history of colon cancer    Family history of melanoma    Family history of prostate cancer    GERD (gastroesophageal reflux disease)    heartburn occasional   Headache    1/week   Hypothyroidism    Incontinence    Joint stiffness    Rhinitis, allergic    Past Surgical History:  Procedure Laterality Date   AUGMENTATION MAMMAPLASTY Bilateral 0000000   silicone   BREAST CYST EXCISION Bilateral 1990's   benign   CERVICAL BIOPSY  W/ LOOP ELECTRODE EXCISION  10/2009   COLONOSCOPY N/A 05/08/2015   Procedure: COLONOSCOPY;  Surgeon: Lucilla Lame, MD;  Location: [facility];  Service: Gastroenterology;  Laterality: N/A;   WISDOM TOOTH EXTRACTION     Social History   Socioeconomic History   Marital status: Married    Spouse name: Not on file   Number of children: Not on file   Years of education: Not on file   Highest education level: Not on file  Occupational History   Not on file  Tobacco Use   Smoking status: Never   Smokeless tobacco: Never  Vaping Use   Vaping Use: Never used  Substance and Sexual Activity    Alcohol use: Yes    Comment: occasional/1 drink 2x week   Drug use: No   Sexual activity: Yes    Birth control/protection: Pill  Other Topics Concern   Not on file  Social History Narrative   Not on file   Social Determinants of Health   Financial Resource Strain: Not on file  Food Insecurity: Not on file  Transportation Needs: Not on file  Physical Activity: Not on file  Stress: Not on file  Social Connections: Not on file  Intimate Partner Violence: Not on file   Family Status  Relation Name Status   Mother  Alive   Father  Deceased at age 86   Sister  Alive   Pat Aunt x2 [place]  Deceased   MGM  Deceased   MGF  Deceased   PGM  Deceased   PGF  Deceased   Son  Alive   Other great m  aunt Alive   Family History  Problem Relation Age of Onset   Arthritis Mother    Hypothyroidism Mother    Hypertension Mother    Hyperlipidemia Mother    Transient ischemic attack Mother    Cancer Mother        breast cancer   Breast cancer Mother 24   Melanoma  Father        dx 74s   Hyperlipidemia Father    Cataracts Father    Emphysema Father    Healthy Sister    Prostate cancer Paternal Uncle        metastatic   Colon cancer Maternal Grandfather        dx 64s   Skin cancer Paternal Grandfather    Breast cancer Other    No Known Allergies  Patient Care Team: Mar Daring, PA-C as PCP - General (Family Medicine)   Medications: Outpatient Medications Prior to Visit  Medication Sig   acetaminophen (TYLENOL) 650 MG CR tablet Take 650 mg by mouth every 8 (eight) hours as needed for pain.   Calcium Carb-Cholecalciferol (CALCIUM + D3) 600-200 MG-UNIT TABS Take by mouth 3 (three) times a week.    calcium carbonate (TUMS - DOSED IN MG ELEMENTAL CALCIUM) 500 MG chewable tablet Chew 1 tablet by mouth as needed for indigestion or heartburn.   ibuprofen (ADVIL,MOTRIN) 200 MG tablet Take 200 mg by mouth every 6 (six) hours as needed for headache.    loratadine  (CLARITIN) 10 MG tablet Take 10 mg by mouth daily. am   Multiple Vitamin (MULTIVITAMIN) capsule Take 1 capsule by mouth daily. am   Sennosides-Docusate Sodium (PERI-COLACE PO) Take by mouth as needed.   Turmeric 500 MG CAPS Take by mouth.   [DISCONTINUED] estradiol (ESTRACE VAGINAL) 0.1 MG/GM vaginal cream Place 1 Applicatorful vaginally 3 (three) times a week.   [DISCONTINUED] levothyroxine (SYNTHROID) 50 MCG tablet TAKE 1 TABLET BY MOUTH ONCE DAILY BEFORE BREAKFAST   [DISCONTINUED] PARoxetine (PAXIL) 20 MG tablet Take 0.5 tablets (10 mg total) by mouth daily.   [DISCONTINUED] triamterene-hydrochlorothiazide (MAXZIDE-25) 37.5-25 MG tablet Take 1 tablet by mouth once daily   [DISCONTINUED] phentermine (ADIPEX-P) 37.5 MG tablet Take 1 tablet (37.5 mg total) by mouth daily before breakfast. (Patient not taking: Reported on 06/18/2021)   No facility-administered medications prior to visit.    Review of Systems  Constitutional:  Positive for diaphoresis.  HENT:  Positive for postnasal drip.   Genitourinary:  Positive for enuresis and urgency.  Musculoskeletal:  Positive for neck pain.  Allergic/Immunologic: Positive for environmental allergies.  Hematological:  Bruises/bleeds easily.  Psychiatric/Behavioral:  Positive for sleep disturbance.   All other systems reviewed and are negative.    Objective    BP 140/85   Pulse 71   Temp 98.5 F (36.9 C) (Oral)   Resp 15   Ht 5' 4.5" (1.638 m)   Wt 157 lb 3.2 oz (71.3 kg)   SpO2 100%   BMI 26.57 kg/m    Physical Exam Vitals and nursing note reviewed.  Constitutional:      General: She is awake. She is not in acute distress.    Appearance: Normal appearance. She is well-developed, well-groomed and overweight. She is not ill-appearing, toxic-appearing or diaphoretic.  HENT:     Head: Normocephalic and atraumatic.     Jaw: There is normal jaw occlusion. No trismus, tenderness, swelling, pain on movement or malocclusion.     Comments: R  jaw pop; pt denies pain    Right Ear: Hearing, tympanic membrane, ear canal and external ear normal. There is no impacted cerumen.     Left Ear: Hearing, tympanic membrane, ear canal and external ear normal. There is no impacted cerumen.     Nose: Nose normal. No congestion or rhinorrhea.     Right Turbinates: Not enlarged, swollen or pale.  Left Turbinates: Not enlarged, swollen or pale.     Right Sinus: No maxillary sinus tenderness or frontal sinus tenderness.     Left Sinus: No maxillary sinus tenderness or frontal sinus tenderness.     Comments: Complaints of PND    Mouth/Throat:     Lips: Pink.     Mouth: Mucous membranes are moist. No injury, lacerations, oral lesions or angioedema.     Tongue: No lesions.     Palate: No mass and lesions.     Pharynx: Oropharynx is clear. Uvula midline. Posterior oropharyngeal erythema present. No pharyngeal swelling, oropharyngeal exudate or uvula swelling.     Tonsils: No tonsillar exudate or tonsillar abscesses.     Comments: Complaints of PND; advised use of Flonase and Zyrtec to help with symptoms Eyes:     General: Lids are normal. Vision grossly intact. Gaze aligned appropriately. No allergic shiner or scleral icterus.       Right eye: No discharge.        Left eye: No discharge.     Extraocular Movements: Extraocular movements intact.     Conjunctiva/sclera: Conjunctivae normal.     Pupils: Pupils are equal, round, and reactive to light.  Neck:     Thyroid: No thyroid mass, thyromegaly or thyroid tenderness.     Vascular: No carotid bruit.     Trachea: Trachea normal.     Comments: Decreased ROM in neck; chronic per pt report, does not interfere with ADLS Cardiovascular:     Rate and Rhythm: Normal rate and regular rhythm.     Pulses: Normal pulses.          Carotid pulses are 2+ on the right side and 2+ on the left side.      Radial pulses are 2+ on the right side and 2+ on the left side.       Dorsalis pedis pulses are 2+ on the  right side and 2+ on the left side.       Posterior tibial pulses are 2+ on the right side and 2+ on the left side.     Heart sounds: Normal heart sounds, S1 normal and S2 normal. No murmur heard.   No friction rub. No gallop.  Pulmonary:     Effort: Pulmonary effort is normal. No respiratory distress.     Breath sounds: Normal breath sounds and air entry. No stridor. No wheezing, rhonchi or rales.  Chest:     Chest wall: No tenderness.     Comments: Exam deferred; discussed SBE and use of 'know your lemons' campaign  Abdominal:     General: Bowel sounds are normal. There is no distension.     Palpations: Abdomen is soft. There is no mass.     Tenderness: There is no abdominal tenderness. There is no guarding or rebound.     Hernia: No hernia is present.  Genitourinary:    Comments: Exam deferred; no acute complaints  Chronic complaint of urinary urgency and stress incontinence; refused referral to pelvic PT at this time Musculoskeletal:        General: No swelling, deformity or signs of injury. Normal range of motion.     Cervical back: Neck supple. No rigidity or tenderness.     Right lower leg: No edema.     Left lower leg: No edema.  Lymphadenopathy:     Cervical: No cervical adenopathy.     Right cervical: No superficial, deep or posterior cervical adenopathy.    Left cervical:  No superficial, deep or posterior cervical adenopathy.  Skin:    General: Skin is warm and dry.     Capillary Refill: Capillary refill takes less than 2 seconds.     Coloration: Skin is not jaundiced or pale.     Findings: Bruising present. No erythema, lesion or rash.     Comments: Scattered bruising  Neurological:     General: No focal deficit present.     Mental Status: She is alert and oriented to person, place, and time. Mental status is at baseline.     Cranial Nerves: No cranial nerve deficit.     Sensory: No sensory deficit.     Motor: Motor function is intact. No weakness.      Coordination: Coordination is intact. Coordination normal.     Gait: Gait is intact. Gait normal.     Deep Tendon Reflexes: Reflexes normal.  Psychiatric:        Attention and Perception: Attention normal.        Mood and Affect: Mood and affect normal.        Speech: Speech normal.        Behavior: Behavior normal. Behavior is cooperative.        Thought Content: Thought content normal.        Cognition and Memory: Cognition normal.        Judgment: Judgment normal.      Last depression screening scores PHQ 2/9 Scores 06/18/2021 06/16/2020 06/16/2020  PHQ - 2 Score 0 0 0  PHQ- 9 Score 2 0 0   Last fall risk screening Fall Risk  06/18/2021  Falls in the past year? 1  Number falls in past yr: 1  Injury with Fall? 1  Comment -  Follow up -   Last Audit-C alcohol use screening Alcohol Use Disorder Test (AUDIT) 06/18/2021  1. How often do you have a drink containing alcohol? 3  2. How many drinks containing alcohol do you have on a typical day when you are drinking? 0  3. How often do you have six or more drinks on one occasion? 0  AUDIT-C Score 3  Alcohol Brief Interventions/Follow-up -   A score of 3 or more in women, and 4 or more in men indicates increased risk for alcohol abuse, EXCEPT if all of the points are from question 1   No results found for any visits on 06/18/21.  Assessment & Plan   Problem List Items Addressed This Visit       Cardiovascular and Mediastinum   Essential hypertension    Is checking at home 120s/70s per pt report Recommend continue to trend at home Refill sent for medication Denies CP, SOB, DOE, or edema      Relevant Medications   triamterene-hydrochlorothiazide (MAXZIDE-25) 37.5-25 MG tablet     Respiratory   Allergic rhinitis    Recommend use of Zyrtec and Flonase Complaints of PND Evidence seen by erythema in back of throat Pt report of worsening symptoms following outside exposure        Endocrine   Adult hypothyroidism     Chronic condition Denies complaints TSH testing today Repeat lab work      Relevant Medications   levothyroxine (SYNTHROID) 50 MCG tablet     Other   HLD (hyperlipidemia)    Reinforced diet and exercise Labs completed      Relevant Medications   triamterene-hydrochlorothiazide (MAXZIDE-25) 37.5-25 MG tablet   Symptomatic menopausal or female climacteric states    Stable  at this time Complaints worse at night Using paxil and estrogen cream to assist with s/s Refills sent- continue to monitor/manage      Borderline diabetes    Reinforced healthy weight A1c done today Recommend small, freq meals- balanced snacks- water to drink      Avitaminosis D    Pulled for lab draw Denies complaints      BMI 26.0-26.9,adult    Discussed healthy weight and weight reduction to assist with management of chronic dz  Restart phen once daily, am- pt previously taking in May 22      Screening mammogram for breast cancer    Due for yearly screen in 09/2021 Denies complaints Discussed 'know your lemon' campaign      Menopausal disorder    Complaints of sleep difficulties d/t hot flashes Slight improvement since start paxil and estrogen cream      Relevant Medications   estradiol (ESTRACE VAGINAL) 0.1 MG/GM vaginal cream   PARoxetine (PAXIL) 20 MG tablet   Routine medical exam - Primary    Management of chronic condition No acute complaints Plan for mammo UTD on PAP and [place] Recc vaccinations for shingles and flu      Relevant Orders   Lipid panel   Magnesium   TSH   VITAMIN D 25 Hydroxy (Vit-D Deficiency, Fractures)   Vitamin B12   Hemoglobin A1c   CBC with Differential/Platelet   Comprehensive metabolic panel   RESOLVED: Overweight (BMI 25.0-29.9)   Relevant Medications   phentermine (ADIPEX-P) 37.5 MG tablet      Routine Health Maintenance and Physical Exam  Exercise Activities and Dietary recommendations  Goals   None     Immunization History   Administered Date(s) Administered   Influenza, Quadrivalent, Recombinant, Inj, Pf 09/06/2020   Influenza,inj,Quad PF,6+ Mos 07/25/2017   Influenza-Unspecified 07/25/2017, 08/15/2018, 07/03/2019   Tdap 04/02/2016    Health Maintenance  Topic Date Due   Zoster Vaccines- Shingrix (1 of 2) Never done   INFLUENZA VACCINE  05/28/2021   MAMMOGRAM  09/27/2022   PAP SMEAR-Modifier  04/07/2023   COLONOSCOPY (Pts 45-56yr Insurance coverage will need to be confirmed)  05/07/2025   TETANUS/TDAP  04/02/2026   Hepatitis C Screening  Completed   HIV Screening  Completed   Pneumococcal Vaccine 05624Years old  Aged Out   HPV VACCINES  Aged Out    Discussed health benefits of physical activity, and encouraged her to engage in regular exercise appropriate for her age and condition.    Return in about 6 months (around 12/19/2021) for chonic disease management.     IVonna Kotyk FNP, have reviewed all documentation for this visit. The documentation on 06/18/21 for the exam, diagnosis, procedures, and orders are all accurate and complete.    EGwyneth Sprout [phone](phone) [phone](fax)  [place]

## 2021-06-19 ENCOUNTER — Other Ambulatory Visit: Payer: Self-pay | Admitting: Family Medicine

## 2021-06-19 DIAGNOSIS — E039 Hypothyroidism, unspecified: Secondary | ICD-10-CM

## 2021-06-19 LAB — COMPREHENSIVE METABOLIC PANEL
ALT: 12 IU/L (ref 0–32)
AST: 28 IU/L (ref 0–40)
Albumin/Globulin Ratio: 2.7 — ABNORMAL HIGH (ref 1.2–2.2)
Albumin: 4.8 g/dL (ref 3.8–4.9)
Alkaline Phosphatase: 123 IU/L — ABNORMAL HIGH (ref 44–121)
BUN/Creatinine Ratio: 15 (ref 9–23)
BUN: 15 mg/dL (ref 6–24)
Bilirubin Total: 0.3 mg/dL (ref 0.0–1.2)
CO2: 26 mmol/L (ref 20–29)
Calcium: 10.1 mg/dL (ref 8.7–10.2)
Chloride: 97 mmol/L (ref 96–106)
Creatinine, Ser: 1.03 mg/dL — ABNORMAL HIGH (ref 0.57–1.00)
Globulin, Total: 1.8 g/dL (ref 1.5–4.5)
Glucose: 99 mg/dL (ref 65–99)
Potassium: 4.2 mmol/L (ref 3.5–5.2)
Sodium: 139 mmol/L (ref 134–144)
Total Protein: 6.6 g/dL (ref 6.0–8.5)
eGFR: 64 mL/min/{1.73_m2} (ref >59)

## 2021-06-19 LAB — VITAMIN D 25 HYDROXY (VIT D DEFICIENCY, FRACTURES): Vit D, 25-Hydroxy: 80.2 ng/mL (ref 30.0–100.0)

## 2021-06-19 LAB — HEMOGLOBIN A1C
Est. average glucose Bld gHb Est-mCnc: 108 mg/dL
Hgb A1c MFr Bld: 5.4 % (ref 4.8–5.6)

## 2021-06-19 LAB — CBC WITH DIFFERENTIAL/PLATELET
Basophils Absolute: 0.1 10*3/uL (ref 0.0–0.2)
Basos: 1 %
EOS (ABSOLUTE): 0.2 10*3/uL (ref 0.0–0.4)
Eos: 4 %
Hematocrit: 36 % (ref 34.0–46.6)
Hemoglobin: 12.3 g/dL (ref 11.1–15.9)
Immature Grans (Abs): 0 10*3/uL (ref 0.0–0.1)
Immature Granulocytes: 0 %
Lymphocytes Absolute: 1.4 10*3/uL (ref 0.7–3.1)
Lymphs: 27 %
MCH: 30.2 pg (ref 26.6–33.0)
MCHC: 34.2 g/dL (ref 31.5–35.7)
MCV: 89 fL (ref 79–97)
Monocytes Absolute: 0.5 10*3/uL (ref 0.1–0.9)
Monocytes: 10 %
Neutrophils Absolute: 3 10*3/uL (ref 1.4–7.0)
Neutrophils: 58 %
Platelets: 308 10*3/uL (ref 150–450)
RBC: 4.07 x10E6/uL (ref 3.77–5.28)
RDW: 13 % (ref 11.7–15.4)
WBC: 5.1 10*3/uL (ref 3.4–10.8)

## 2021-06-19 LAB — LIPID PANEL
Chol/HDL Ratio: 2 ratio (ref 0.0–4.4)
Cholesterol, Total: 247 mg/dL — ABNORMAL HIGH (ref 100–199)
HDL: 121 mg/dL (ref >39)
LDL Chol Calc (NIH): 112 mg/dL — ABNORMAL HIGH (ref 0–99)
Triglycerides: 85 mg/dL (ref 0–149)
VLDL Cholesterol Cal: 14 mg/dL (ref 5–40)

## 2021-06-19 LAB — TSH: TSH: 0.418 u[IU]/mL — ABNORMAL LOW (ref 0.450–4.500)

## 2021-06-19 LAB — MAGNESIUM: Magnesium: 1.9 mg/dL (ref 1.6–2.3)

## 2021-06-19 LAB — VITAMIN B12: Vitamin B-12: 201 pg/mL — ABNORMAL LOW (ref 232–1245)

## 2021-06-19 MED ORDER — LEVOTHYROXINE SODIUM 25 MCG PO TABS: 25.0000 ug | ORAL_TABLET | Freq: Every day | ORAL | 1 refills | Status: DC

## 2021-10-23 ENCOUNTER — Encounter: Payer: Self-pay | Admitting: Family Medicine

## 2021-10-31 ENCOUNTER — Other Ambulatory Visit: Payer: Self-pay | Admitting: Family Medicine

## 2021-10-31 DIAGNOSIS — Z1231 Encounter for screening mammogram for malignant neoplasm of breast: Secondary | ICD-10-CM

## 2021-11-08 ENCOUNTER — Other Ambulatory Visit: Payer: Self-pay | Admitting: Family Medicine

## 2021-11-08 DIAGNOSIS — D239 Other benign neoplasm of skin, unspecified: Secondary | ICD-10-CM

## 2021-12-17 ENCOUNTER — Other Ambulatory Visit: Payer: Self-pay | Admitting: Family Medicine

## 2021-12-17 DIAGNOSIS — E039 Hypothyroidism, unspecified: Secondary | ICD-10-CM

## 2021-12-18 NOTE — Telephone Encounter (Signed)
Requested Prescriptions  Pending Prescriptions Disp Refills   levothyroxine (SYNTHROID) 25 MCG tablet [Pharmacy Med Name: Levothyroxine Sodium 25 MCG Oral Tablet] 90 tablet 0    Sig: TAKE 1 TABLET BY MOUTH DAILY BEFORE BREAKFAST     Endocrinology:  Hypothyroid Agents Failed - 12/17/2021  4:59 AM      Failed - TSH in normal range and within 360 days    TSH  Date Value Ref Range Status  06/18/2021 0.418 (L) 0.450 - 4.500 uIU/mL Final         Passed - Valid encounter within last 12 months    Recent Outpatient Visits          6 months ago Routine medical exam   [facility] Gwyneth Sprout, FNP   1 year ago Menopausal disorder   [place], Clearnce Sorrel, [place]   1 year ago Annual physical exam   [place], Clearnce Sorrel, [place]   2 years ago Essential hypertension   [place], [place], [place]   2 years ago Rotator cuff impingement syndrome of right shoulder   [facility] [place], [place], [place]

## 2021-12-20 ENCOUNTER — Other Ambulatory Visit: Payer: Self-pay

## 2021-12-20 ENCOUNTER — Ambulatory Visit
Admission: RE | Admit: 2021-12-20 | Discharge: 2021-12-20 | Disposition: A | Discharge status: Home / Self Care | Payer: BC Managed Care – PPO | Source: Ambulatory Visit | Attending: Family Medicine | Admitting: Family Medicine

## 2021-12-20 DIAGNOSIS — Z1231 Encounter for screening mammogram for malignant neoplasm of breast: Secondary | ICD-10-CM | POA: Insufficient documentation

## 2022-03-06 ENCOUNTER — Telehealth: Payer: BC Managed Care – PPO | Admitting: Physician Assistant

## 2022-03-06 VITALS — BP 120/63 | HR 98

## 2022-03-06 DIAGNOSIS — B9689 Other specified bacterial agents as the cause of diseases classified elsewhere: Secondary | ICD-10-CM | POA: Diagnosis not present

## 2022-03-06 DIAGNOSIS — J208 Acute bronchitis due to other specified organisms: Secondary | ICD-10-CM | POA: Diagnosis not present

## 2022-03-06 MED ORDER — ALBUTEROL SULFATE HFA 108 (90 BASE) MCG/ACT IN AERS: 2.0000 | INHALATION_SPRAY | Freq: Four times a day (QID) | RESPIRATORY_TRACT | 0 refills | Status: DC | PRN

## 2022-03-06 MED ORDER — DOXYCYCLINE HYCLATE 100 MG PO TABS: 100.0000 mg | ORAL_TABLET | Freq: Two times a day (BID) | ORAL | 0 refills | Status: DC

## 2022-03-06 MED ORDER — BENZONATATE 100 MG PO CAPS: 100.0000 mg | ORAL_CAPSULE | Freq: Three times a day (TID) | ORAL | 0 refills | Status: DC | PRN

## 2022-03-06 NOTE — Progress Notes (Signed)
?Virtual Visit Consent  ? ?Erika May, you are scheduled for a virtual visit with a [facility] provider today. Just as with appointments in the office, your consent must be obtained to participate. Your consent will be active for this visit and any virtual visit you may have with one of our providers in the next 365 days. If you have a MyChart account, a copy of this consent can be sent to you electronically. ? ?As this is a virtual visit, video technology does not allow for your provider to perform a traditional examination. This may limit your provider's ability to fully assess your condition. If your provider identifies any concerns that need to be evaluated in person or the need to arrange testing (such as labs, EKG, etc.), we will make arrangements to do so. Although advances in technology are sophisticated, we cannot ensure that it will always work on either your end or our end. If the connection with a video visit is poor, the visit may have to be switched to a telephone visit. With either a video or telephone visit, we are not always able to ensure that we have a secure connection. ? ?By engaging in this virtual visit, you consent to the provision of healthcare and authorize for your insurance to be billed (if applicable) for the services provided during this visit. Depending on your insurance coverage, you may receive a charge related to this service. ? ?I need to obtain your verbal consent now. Are you willing to proceed with your visit today? Erika May has provided verbal consent on 03/06/2022 for a virtual visit (video or telephone). Leeanne Rio, PA-C ? ?Date: 03/06/2022 12:56 PM ? ?Virtual Visit via Video Note  ? ?Clovis Fredrickson, connected with  Erika May  ([id number], February 01, 1966) on 03/06/22 at  1:00 PM EDT by a video-enabled telemedicine application and verified that I am speaking with the correct person using two identifiers. ? ?Location: ?Patient:  Virtual Visit Location Patient: Home ?Provider: Virtual Visit Location Provider: Home Office ?  ?I discussed the limitations of evaluation and management by telemedicine and the availability of in person appointments. The patient expressed understanding and agreed to proceed.   ? ?History of Present Illness: ?Erika May is a 56 y.o. who identifies as a female who was assigned female at birth, and is being seen today for progressive URI symptoms starting last week and worsening on Monday. For her, she is having substantial chest congestion and cough that is now productive of colored and thick phlegm. Denies chest pain or SOB. Now she is with a fever down to 100 with OTC medication. Notes rattling sensation in her chest now. Has taken COVID tests which were negative. Notes substantial fatigue.  ? ? ?HPI: HPI  ?Problems:  ?Patient Active Problem List  ? Diagnosis Date Noted  ? Screening mammogram for breast cancer 06/18/2021  ? Menopausal disorder 06/18/2021  ? Routine medical exam 06/18/2021  ? Essential hypertension 06/16/2020  ? Genetic testing 03/13/2020  ? Family history of breast cancer   ? Family history of melanoma   ? Family history of colon cancer   ? Family history of prostate cancer   ? BMI 26.0-26.9,adult 04/03/2017  ? HLD (hyperlipidemia) 03/13/2016  ? Family planning 03/13/2016  ? Benign neoplasm of skin 03/13/2016  ? Symptomatic menopausal or female climacteric states 03/13/2016  ? Borderline diabetes 03/13/2016  ? Contact dermatitis due to Genus Toxicodendron 03/13/2016  ? Avitaminosis D 03/13/2016  ? Adult hypothyroidism 10/09/2015  ?  First degree hemorrhoids   ? Fibroid 04/15/2008  ? Allergic rhinitis 08/10/2007  ? Narrowing of intervertebral disc space 08/10/2007  ?  ?Allergies: No Known Allergies ?Medications:  ?Current Outpatient Medications:  ?  albuterol (VENTOLIN HFA) 108 (90 Base) MCG/ACT inhaler, Inhale 2 puffs into the lungs every 6 (six) hours as needed for wheezing or shortness  of breath., Disp: 8 g, Rfl: 0 ?  benzonatate (TESSALON) 100 MG capsule, Take 1 capsule (100 mg total) by mouth 3 (three) times daily as needed for cough., Disp: 30 capsule, Rfl: 0 ?  diclofenac Sodium (VOLTAREN) 1 % GEL, Apply topically 4 (four) times daily., Disp: , Rfl:  ?  doxycycline (VIBRA-TABS) 100 MG tablet, Take 1 tablet (100 mg total) by mouth 2 (two) times daily., Disp: 14 tablet, Rfl: 0 ?  acetaminophen (TYLENOL) 650 MG CR tablet, Take 650 mg by mouth every 8 (eight) hours as needed for pain., Disp: , Rfl:  ?  Calcium Carb-Cholecalciferol (CALCIUM + D3) 600-200 MG-UNIT TABS, Take by mouth 3 (three) times a week. , Disp: , Rfl:  ?  calcium carbonate (TUMS - DOSED IN MG ELEMENTAL CALCIUM) 500 MG chewable tablet, Chew 1 tablet by mouth as needed for indigestion or heartburn., Disp: , Rfl:  ?  estradiol (ESTRACE VAGINAL) 0.1 MG/GM vaginal cream, Place 1 Applicatorful vaginally 3 (three) times a week., Disp: 42.5 g, Rfl: 12 ?  ibuprofen (ADVIL,MOTRIN) 200 MG tablet, Take 200 mg by mouth every 6 (six) hours as needed for headache. , Disp: , Rfl:  ?  levothyroxine (SYNTHROID) 25 MCG tablet, TAKE 1 TABLET BY MOUTH DAILY BEFORE BREAKFAST, Disp: 90 tablet, Rfl: 0 ?  loratadine (CLARITIN) 10 MG tablet, Take 10 mg by mouth daily. am, Disp: , Rfl:  ?  Multiple Vitamin (MULTIVITAMIN) capsule, Take 1 capsule by mouth daily. am, Disp: , Rfl:  ?  PARoxetine (PAXIL) 20 MG tablet, Take 1 tablet (20 mg total) by mouth daily., Disp: 90 tablet, Rfl: 3 ?  Sennosides-Docusate Sodium (PERI-COLACE PO), Take by mouth as needed., Disp: , Rfl:  ?  triamterene-hydrochlorothiazide (MAXZIDE-25) 37.5-25 MG tablet, Take 1 tablet by mouth daily., Disp: 90 tablet, Rfl: 3 ?  Turmeric 500 MG CAPS, Take by mouth., Disp: , Rfl:  ? ?Observations/Objective: ?Patient is well-developed, well-nourished in no acute distress.  ?Resting comfortably at home.  ?Head is normocephalic, atraumatic.  ?No labored breathing. ?Speech is clear and coherent with  logical content.  ?Patient is alert and oriented at baseline.  ? ?Assessment and Plan: ?1. Acute bacterial bronchitis ?- benzonatate (TESSALON) 100 MG capsule; Take 1 capsule (100 mg total) by mouth 3 (three) times daily as needed for cough.  Dispense: 30 capsule; Refill: 0 ?- albuterol (VENTOLIN HFA) 108 (90 Base) MCG/ACT inhaler; Inhale 2 puffs into the lungs every 6 (six) hours as needed for wheezing or shortness of breath.  Dispense: 8 g; Refill: 0 ?- doxycycline (VIBRA-TABS) 100 MG tablet; Take 1 tablet (100 mg total) by mouth 2 (two) times daily.  Dispense: 14 tablet; Refill: 0 ? ?Rx Doxycycline.  Increase fluids.  Rest.  Saline nasal spray.  Probiotic.  Mucinex as directed.  Humidifier in bedroom. Tessalon and Albuterol per orders.  Call or return to clinic if symptoms are not improving. ? ? ?Follow Up Instructions: ?I discussed the assessment and treatment plan with the patient. The patient was provided an opportunity to ask questions and all were answered. The patient agreed with the plan and demonstrated an understanding of the instructions.  A copy of instructions were sent to the patient via MyChart unless otherwise noted below.  ? ?The patient was advised to call back or seek an in-person evaluation if the symptoms worsen or if the condition fails to improve as anticipated. ? ?Time:  ?I spent 10 minutes with the patient via telehealth technology discussing the above problems/concerns.   ? ?Leeanne Rio, PA-C ?

## 2022-03-06 NOTE — Patient Instructions (Signed)
?Detta Jewell-Hinkle, thank you for joining Leeanne Rio, PA-C for today's virtual visit.  While this provider is not your primary care provider (PCP), if your PCP is located in our provider database this encounter information will be shared with them immediately following your visit. ? ?Consent: ?(Patient) Roshawnda Jewell-Hinkle provided verbal consent for this virtual visit at the beginning of the encounter. ? ?Current Medications: ? ?Current Outpatient Medications:  ?  acetaminophen (TYLENOL) 650 MG CR tablet, Take 650 mg by mouth every 8 (eight) hours as needed for pain., Disp: , Rfl:  ?  Calcium Carb-Cholecalciferol (CALCIUM + D3) 600-200 MG-UNIT TABS, Take by mouth 3 (three) times a week. , Disp: , Rfl:  ?  calcium carbonate (TUMS - DOSED IN MG ELEMENTAL CALCIUM) 500 MG chewable tablet, Chew 1 tablet by mouth as needed for indigestion or heartburn., Disp: , Rfl:  ?  estradiol (ESTRACE VAGINAL) 0.1 MG/GM vaginal cream, Place 1 Applicatorful vaginally 3 (three) times a week., Disp: 42.5 g, Rfl: 12 ?  ibuprofen (ADVIL,MOTRIN) 200 MG tablet, Take 200 mg by mouth every 6 (six) hours as needed for headache. , Disp: , Rfl:  ?  levothyroxine (SYNTHROID) 25 MCG tablet, TAKE 1 TABLET BY MOUTH DAILY BEFORE BREAKFAST, Disp: 90 tablet, Rfl: 0 ?  loratadine (CLARITIN) 10 MG tablet, Take 10 mg by mouth daily. am, Disp: , Rfl:  ?  Multiple Vitamin (MULTIVITAMIN) capsule, Take 1 capsule by mouth daily. am, Disp: , Rfl:  ?  PARoxetine (PAXIL) 20 MG tablet, Take 1 tablet (20 mg total) by mouth daily., Disp: 90 tablet, Rfl: 3 ?  phentermine (ADIPEX-P) 37.5 MG tablet, Take 1 tablet (37.5 mg total) by mouth daily before breakfast., Disp: 90 tablet, Rfl: 1 ?  Sennosides-Docusate Sodium (PERI-COLACE PO), Take by mouth as needed., Disp: , Rfl:  ?  triamterene-hydrochlorothiazide (MAXZIDE-25) 37.5-25 MG tablet, Take 1 tablet by mouth daily., Disp: 90 tablet, Rfl: 3 ?  Turmeric 500 MG CAPS, Take by mouth., Disp: , Rfl:    ? ?Medications ordered in this encounter:  ?No orders of the defined types were placed in this encounter. ?  ? ?*If you need refills on other medications prior to your next appointment, please contact your pharmacy* ? ?Follow-Up: ?Call back or seek an in-person evaluation if the symptoms worsen or if the condition fails to improve as anticipated. ? ?Other Instructions ?Take antibiotic (Doxycycline) as directed.  Increase fluids.  Get plenty of rest. Use Mucinex for congestion. Use the Tessalon as directed. Take a daily probiotic (I recommend Align or Culturelle, but even Activia Yogurt may be beneficial).  A humidifier placed in the bedroom may offer some relief for a dry, scratchy throat of nasal irritation.  Read information below on acute bronchitis. Please call or return to clinic if symptoms are not improving. ? ?Acute Bronchitis ?Bronchitis is when the airways that extend from the windpipe into the lungs get red, puffy, and painful (inflamed). Bronchitis often causes thick spit (mucus) to develop. This leads to a cough. A cough is the most common symptom of bronchitis. ?In acute bronchitis, the condition usually begins suddenly and goes away over time (usually in 2 weeks). Smoking, allergies, and asthma can make bronchitis worse. Repeated episodes of bronchitis may cause more lung problems. ? ?HOME CARE ?Rest. ?Drink enough fluids to keep your pee (urine) clear or pale yellow (unless you need to limit fluids as told by your doctor). ?Only take over-the-counter or prescription medicines as told by your doctor. ?Avoid smoking  and secondhand smoke. These can make bronchitis worse. If you are a smoker, think about using nicotine gum or skin patches. Quitting smoking will help your lungs heal faster. ?Reduce the chance of getting bronchitis again by: ?Washing your hands often. ?Avoiding people with cold symptoms. ?Trying not to touch your hands to your mouth, nose, or eyes. ?Follow up with your doctor as  told. ? ?GET HELP IF: ?Your symptoms do not improve after 1 week of treatment. Symptoms include: ?Cough. ?Fever. ?Coughing up thick spit. ?Body aches. ?Chest congestion. ?Chills. ?Shortness of breath. ?Sore throat. ? ?GET HELP RIGHT AWAY IF:  ?You have an increased fever. ?You have chills. ?You have severe shortness of breath. ?You have bloody thick spit (sputum). ?You throw up (vomit) often. ?You lose too much body fluid (dehydration). ?You have a severe headache. ?You faint. ? ?MAKE SURE YOU:  ?Understand these instructions. ?Will watch your condition. ?Will get help right away if you are not doing well or get worse. ?Document Released: 04/01/2008 Document Revised: 06/16/2013 Document Reviewed: 04/06/2013 ?ExitCare? Patient Information ?2015 [employer]. This information is not intended to replace advice given to you by your health care provider. Make sure you discuss any questions you have with your health care provider. ? ? ? ?If you have been instructed to have an in-person evaluation today at a local Urgent Care facility, please use the link below. It will take you to a list of all of our available [facility], including address, phone number and hours of operation. Please do not delay care.  ?[facility] ? ?If you or a family member do not have a primary care provider, use the link below to schedule a visit and establish care. When you choose a [facility] primary care physician or advanced practice provider, you gain a long-term partner in health. ?Find a Primary Care Provider ? ?Learn more about [facility] in-office and virtual care options: ?[facility]  ?

## 2022-03-20 ENCOUNTER — Other Ambulatory Visit: Payer: Self-pay | Admitting: Family Medicine

## 2022-03-20 DIAGNOSIS — E039 Hypothyroidism, unspecified: Secondary | ICD-10-CM

## 2022-06-19 ENCOUNTER — Encounter: Payer: Self-pay | Admitting: Physician Assistant

## 2022-06-19 ENCOUNTER — Ambulatory Visit (INDEPENDENT_AMBULATORY_CARE_PROVIDER_SITE_OTHER): Payer: BC Managed Care – PPO | Admitting: Physician Assistant

## 2022-06-19 VITALS — BP 121/83 | HR 83 | Ht 64.5 in | Wt 161.2 lb

## 2022-06-19 DIAGNOSIS — E663 Overweight: Secondary | ICD-10-CM | POA: Diagnosis not present

## 2022-06-19 DIAGNOSIS — Z23 Encounter for immunization: Secondary | ICD-10-CM | POA: Diagnosis not present

## 2022-06-19 DIAGNOSIS — E7800 Pure hypercholesterolemia, unspecified: Secondary | ICD-10-CM

## 2022-06-19 DIAGNOSIS — E538 Deficiency of other specified B group vitamins: Secondary | ICD-10-CM

## 2022-06-19 DIAGNOSIS — E039 Hypothyroidism, unspecified: Secondary | ICD-10-CM

## 2022-06-19 DIAGNOSIS — N959 Unspecified menopausal and perimenopausal disorder: Secondary | ICD-10-CM | POA: Diagnosis not present

## 2022-06-19 DIAGNOSIS — Z0000 Encounter for general adult medical examination without abnormal findings: Secondary | ICD-10-CM | POA: Diagnosis not present

## 2022-06-19 DIAGNOSIS — I10 Essential (primary) hypertension: Secondary | ICD-10-CM

## 2022-06-19 MED ORDER — TRIAMTERENE-HCTZ 37.5-25 MG PO TABS: 1.0000 | ORAL_TABLET | Freq: Every day | ORAL | 3 refills | Status: DC

## 2022-06-19 NOTE — Assessment & Plan Note (Signed)
Well controlled continue current meds Ordered cmp F/u 57mo

## 2022-06-19 NOTE — Assessment & Plan Note (Addendum)
Managing hot flashes with paxil 20 mg , uses estrogen cream once a week for menopausal vaginal symptoms Consider transitioning to effexor if all labs WNL. Discussed HRT, but pt's mother had a history of breast cancer, explained the risks with long-term use.

## 2022-06-19 NOTE — Assessment & Plan Note (Addendum)
Historically not on statin will recheck fasting

## 2022-06-19 NOTE — Progress Notes (Signed)
I,Sha'taria Tyson,acting as a Education administrator for [employer], PA-C.,have documented all relevant documentation on the behalf of Mikey Kirschner, PA-C,as directed by  Mikey Kirschner, PA-C while in the presence of Mikey Kirschner, PA-C.   Complete physical exam   Patient: Erika May   DOB: November 13, 1965   56 y.o. Female  MRN: [id number] Visit Date: 06/19/2022  Today's healthcare provider: Mikey Kirschner, PA-C   Cc. cpe  Subjective    Erika May is a 56 y.o. female who presents today for a complete physical exam.  She reports consuming a general diet.  The patient reports doing a brisk walk or treadmill at least 2 times a week for 20 minutes.  She generally feels well. She reports sleeping well. She does have additional problems to discuss today.  HPI  Hot flashes -Pt has been managing with paxil but she feels this is no longer working, she gets 1-3 hot flashes daily, full sweats. Denies any symptoms of chest pain, SOB. Questions about hormone therapy.   Weight gain -Gradual but pt is concerned. Previously took phentermine with success but felt it kept her up all day/night.   Past Medical History:  Diagnosis Date   Allergic dermatitis due to poison ivy    Broken neck ([place])    broke C6 as a child/ experiences stiffness   DDD (degenerative disc disease), lumbar    Family history of breast cancer    Family history of colon cancer    Family history of melanoma    Family history of prostate cancer    GERD (gastroesophageal reflux disease)    heartburn occasional   Headache    1/week   Hypothyroidism    Incontinence    Joint stiffness    Rhinitis, allergic    Past Surgical History:  Procedure Laterality Date   AUGMENTATION MAMMAPLASTY Bilateral 9242   silicone   BREAST CYST EXCISION Bilateral 1990's   benign   CERVICAL BIOPSY  W/ LOOP ELECTRODE EXCISION  10/2009   COLONOSCOPY N/A 05/08/2015   Procedure: COLONOSCOPY;  Surgeon: Lucilla Lame, MD;  Location:  [facility];  Service: Gastroenterology;  Laterality: N/A;   WISDOM TOOTH EXTRACTION     Social History   Socioeconomic History   Marital status: Married    Spouse name: Not on file   Number of children: Not on file   Years of education: Not on file   Highest education level: Not on file  Occupational History   Not on file  Tobacco Use   Smoking status: Never   Smokeless tobacco: Never  Vaping Use   Vaping Use: Never used  Substance and Sexual Activity   Alcohol use: Yes    Comment: occasional/1 drink 2x week   Drug use: No   Sexual activity: Yes    Birth control/protection: Pill  Other Topics Concern   Not on file  Social History Narrative   Not on file   Social Determinants of Health   Financial Resource Strain: Not on file  Food Insecurity: Not on file  Transportation Needs: Not on file  Physical Activity: Not on file  Stress: Not on file  Social Connections: Not on file  Intimate Partner Violence: Not on file   Family Status  Relation Name Status   Mother  Alive   Father  Deceased at age 53   Sister  Alive   Pat Aunt x2 [place]  Deceased   MGM  Deceased   MGF  Deceased  PGM  Deceased   PGF  Deceased   Son  Alive   Other great m  auntx2 30   Family History  Problem Relation Age of Onset   Arthritis Mother    Hypothyroidism Mother    Hypertension Mother    Hyperlipidemia Mother    Transient ischemic attack Mother    Cancer Mother        breast cancer   Breast cancer Mother 60   Melanoma Father        dx 39s   Hyperlipidemia Father    Cataracts Father    Emphysema Father    Healthy Sister    Prostate cancer Paternal Uncle        metastatic   Colon cancer Maternal Grandfather        dx 54s   Skin cancer Paternal Grandfather    Breast cancer Other    No Known Allergies  Patient Care Team: Gwyneth Sprout, FNP as PCP - General (Family Medicine)   Medications: Outpatient Medications Prior to Visit  Medication Sig    acetaminophen (TYLENOL) 650 MG CR tablet Take 650 mg by mouth every 8 (eight) hours as needed for pain.   Calcium Carb-Cholecalciferol (CALCIUM + D3) 600-200 MG-UNIT TABS Take by mouth 3 (three) times a week.    calcium carbonate (TUMS - DOSED IN MG ELEMENTAL CALCIUM) 500 MG chewable tablet Chew 1 tablet by mouth as needed for indigestion or heartburn.   diclofenac Sodium (VOLTAREN) 1 % GEL Apply topically 4 (four) times daily.   estradiol (ESTRACE VAGINAL) 0.1 MG/GM vaginal cream Place 1 Applicatorful vaginally 3 (three) times a week.   ibuprofen (ADVIL,MOTRIN) 200 MG tablet Take 200 mg by mouth every 6 (six) hours as needed for headache.    levothyroxine (SYNTHROID) 25 MCG tablet Take 1 tablet (25 mcg total) by mouth daily before breakfast. Please schedule office visit before any future refill   Multiple Vitamin (MULTIVITAMIN) capsule Take 1 capsule by mouth daily. am   PARoxetine (PAXIL) 20 MG tablet Take 1 tablet (20 mg total) by mouth daily.   Sennosides-Docusate Sodium (PERI-COLACE PO) Take by mouth as needed.   [DISCONTINUED] triamterene-hydrochlorothiazide (MAXZIDE-25) 37.5-25 MG tablet Take 1 tablet by mouth daily.   albuterol (VENTOLIN HFA) 108 (90 Base) MCG/ACT inhaler Inhale 2 puffs into the lungs every 6 (six) hours as needed for wheezing or shortness of breath. (Patient not taking: Reported on 06/19/2022)   [DISCONTINUED] benzonatate (TESSALON) 100 MG capsule Take 1 capsule (100 mg total) by mouth 3 (three) times daily as needed for cough. (Patient not taking: Reported on 06/19/2022)   [DISCONTINUED] doxycycline (VIBRA-TABS) 100 MG tablet Take 1 tablet (100 mg total) by mouth 2 (two) times daily. (Patient not taking: Reported on 06/19/2022)   [DISCONTINUED] loratadine (CLARITIN) 10 MG tablet Take 10 mg by mouth daily. am (Patient not taking: Reported on 06/19/2022)   [DISCONTINUED] Turmeric 500 MG CAPS Take by mouth. (Patient not taking: Reported on 06/19/2022)   No facility-administered  medications prior to visit.    Review of Systems  Constitutional:  Positive for activity change, appetite change and diaphoresis.  HENT:  Positive for postnasal drip.   Genitourinary:  Positive for urgency.  Musculoskeletal:  Positive for arthralgias, back pain and myalgias.  Allergic/Immunologic: Positive for environmental allergies.     Objective     BP 121/83 (BP Location: Left Arm, Patient Position: Sitting, Cuff Size: Normal)   Pulse 83   Ht 5' 4.5" (1.638 m)   Wt  161 lb 3.2 oz (73.1 kg)   LMP 07/12/2019   SpO2 98%   BMI 27.24 kg/m     Physical Exam Constitutional:      General: She is awake.     Appearance: She is well-developed. She is not ill-appearing.  HENT:     Head: Normocephalic.     Right Ear: Tympanic membrane normal.     Left Ear: Tympanic membrane normal.     Nose: Nose normal. No congestion or rhinorrhea.     Mouth/Throat:     Pharynx: No oropharyngeal exudate or posterior oropharyngeal erythema.  Eyes:     Conjunctiva/sclera: Conjunctivae normal.     Pupils: Pupils are equal, round, and reactive to light.  Neck:     Thyroid: No thyroid mass or thyromegaly.  Cardiovascular:     Rate and Rhythm: Normal rate and regular rhythm.     Heart sounds: Normal heart sounds.  Pulmonary:     Effort: Pulmonary effort is normal.     Breath sounds: Normal breath sounds.  Abdominal:     Palpations: Abdomen is soft.     Tenderness: There is no abdominal tenderness.  Musculoskeletal:     Right lower leg: No swelling. No edema.     Left lower leg: No swelling. No edema.  Lymphadenopathy:     Cervical: No cervical adenopathy.  Skin:    General: Skin is warm.  Neurological:     Mental Status: She is alert and oriented to person, place, and time.  Psychiatric:        Attention and Perception: Attention normal.        Mood and Affect: Mood normal.        Speech: Speech normal.        Behavior: Behavior normal. Behavior is cooperative.      Last depression  screening scores    06/19/2022    2:14 PM 06/18/2021    8:50 AM 06/16/2020    8:02 AM  PHQ 2/9 Scores  PHQ - 2 Score 0 0 0  PHQ- 9 Score 2 2 0   Last fall risk screening    06/19/2022    2:14 PM  [place] in the past year? 0  Number falls in past yr: 0  Injury with Fall? 0  Risk for fall due to : No Fall Risks   Last Audit-C alcohol use screening    06/19/2022    2:14 PM  Alcohol Use Disorder Test (AUDIT)  1. How often do you have a drink containing alcohol? 3  2. How many drinks containing alcohol do you have on a typical day when you are drinking? 0  3. How often do you have six or more drinks on one occasion? 0  AUDIT-C Score 3   A score of 3 or more in women, and 4 or more in men indicates increased risk for alcohol abuse, EXCEPT if all of the points are from question 1   No results found for any visits on 06/19/22.  Assessment & Plan    Routine Health Maintenance and Physical Exam  Exercise Activities and Dietary recommendations --balanced diet high in fiber and protein, low in sugars, carbs, fats. --physical activity/exercise 30 minutes 3-5 times a week     Immunization History  Administered Date(s) Administered   Influenza, Quadrivalent, Recombinant, Inj, Pf 09/06/2020   Influenza,inj,Quad PF,6+ Mos 07/25/2017   Influenza-Unspecified 07/25/2017, 08/15/2018, 07/03/2019   Tdap 04/02/2016    Health Maintenance  Topic  Date Due   Zoster Vaccines- Shingrix (1 of 2) Never done   INFLUENZA VACCINE  05/28/2022   MAMMOGRAM  12/20/2022   PAP SMEAR-Modifier  04/07/2023   COLONOSCOPY (Pts 45-65yr Insurance coverage will need to be confirmed)  05/07/2025   TETANUS/TDAP  04/02/2026   Hepatitis C Screening  Completed   HIV Screening  Completed   HPV VACCINES  Aged Out    Discussed health benefits of physical activity, and encouraged her to engage in regular exercise appropriate for her age and condition.  Problem List Items Addressed This Visit        Cardiovascular and Mediastinum   Essential hypertension    Well controlled continue current meds Ordered cmp F/u 680mo    Relevant Medications   triamterene-hydrochlorothiazide (MAXZIDE-25) 37.5-25 MG tablet   Other Relevant Orders   Comprehensive Metabolic Panel (CMET)     Endocrine   Adult hypothyroidism    Previous tsh low managed on 25 mcg levothyroxine, will recheck tsh/t4      Relevant Orders   TSH + free T4     Other   HLD (hyperlipidemia)    Historically not on statin will recheck fasting       Relevant Medications   triamterene-hydrochlorothiazide (MAXZIDE-25) 37.5-25 MG tablet   Other Relevant Orders   Lipid panel   Menopausal disorder    Managing hot flashes with paxil 20 mg , uses estrogen cream once a week for menopausal vaginal symptoms Consider transitioning to effexor if all labs WNL. Discussed HRT, but pt's mother had a history of breast cancer, explained the risks with long-term use.        Overweight (BMI 25.0-29.9)    Discussed pt is not a candidate for other weight loss meds ie wegovy, saxenda as her BMI is <30 and she does not have 2+ comorbidities Could potentially start phentermine again, but advised short term, 12 weeks. Will check labs first before rx      Vitamin B12 deficiency   Relevant Orders   Vitamin B12   CBC w/Diff/Platelet   Other Visit Diagnoses     Annual physical exam    -  Primary   Need for shingles vaccine       Relevant Orders   Zoster Recombinant (Shingrix )        Return in about 6 months (around 12/20/2022) for chronic conditions, hypertension, shingles vax.     I, LiMikey KirschnerPA-C have reviewed all documentation for this visit. The documentation on 06/19/2022  for the exam, diagnosis, procedures, and orders are all accurate and complete.  LiMikey KirschnerPA-C [facility] BuWood [id number]: [phone]: [phone]

## 2022-06-19 NOTE — Patient Instructions (Signed)
Plant-derived estrogens (phytoestrogens) -- Plant-derived estrogens have been marketed as a "natural" or "safer" alternative to hormones for relieving menopausal symptoms. Phytoestrogens are found in many foods, including soybeans, chickpeas, lentils, flaxseed, lentils, grains, fruits, vegetables, and red clover. Supplements containing isoflavone, a type of phytoestrogen, can be purchased in health food stores.  Mind-body and other treatments -- Stress management, relaxation, deep breathing, and yoga might be helpful for some people, but study results have been inconsistent; however, these approaches are not likely to be harmful and may have other benefits.

## 2022-06-19 NOTE — Assessment & Plan Note (Signed)
Discussed pt is not a candidate for other weight loss meds ie wegovy, saxenda as her BMI is <30 and she does not have 2+ comorbidities Could potentially start phentermine again, but advised short term, 12 weeks. Will check labs first before rx

## 2022-06-19 NOTE — Assessment & Plan Note (Addendum)
Previous tsh low managed on 25 mcg levothyroxine, will recheck tsh/t4

## 2022-06-22 LAB — COMPREHENSIVE METABOLIC PANEL
ALT: 13 IU/L (ref 0–32)
AST: 24 IU/L (ref 0–40)
Albumin/Globulin Ratio: 3 — ABNORMAL HIGH (ref 1.2–2.2)
Albumin: 5.1 g/dL — ABNORMAL HIGH (ref 3.8–4.9)
Alkaline Phosphatase: 109 IU/L (ref 44–121)
BUN/Creatinine Ratio: 17 (ref 9–23)
BUN: 18 mg/dL (ref 6–24)
Bilirubin Total: 0.4 mg/dL (ref 0.0–1.2)
CO2: 25 mmol/L (ref 20–29)
Calcium: 10.4 mg/dL — ABNORMAL HIGH (ref 8.7–10.2)
Chloride: 95 mmol/L — ABNORMAL LOW (ref 96–106)
Creatinine, Ser: 1.07 mg/dL — ABNORMAL HIGH (ref 0.57–1.00)
Globulin, Total: 1.7 g/dL (ref 1.5–4.5)
Glucose: 92 mg/dL (ref 70–99)
Potassium: 4 mmol/L (ref 3.5–5.2)
Sodium: 137 mmol/L (ref 134–144)
Total Protein: 6.8 g/dL (ref 6.0–8.5)
eGFR: 61 mL/min/{1.73_m2} (ref >59)

## 2022-06-22 LAB — CBC WITH DIFFERENTIAL/PLATELET
Basophils Absolute: 0.1 10*3/uL (ref 0.0–0.2)
Basos: 1 %
EOS (ABSOLUTE): 0.3 10*3/uL (ref 0.0–0.4)
Eos: 6 %
Hematocrit: 38.7 % (ref 34.0–46.6)
Hemoglobin: 13.3 g/dL (ref 11.1–15.9)
Immature Grans (Abs): 0 10*3/uL (ref 0.0–0.1)
Immature Granulocytes: 0 %
Lymphocytes Absolute: 1.2 10*3/uL (ref 0.7–3.1)
Lymphs: 23 %
MCH: 30.6 pg (ref 26.6–33.0)
MCHC: 34.4 g/dL (ref 31.5–35.7)
MCV: 89 fL (ref 79–97)
Monocytes Absolute: 0.7 10*3/uL (ref 0.1–0.9)
Monocytes: 13 %
Neutrophils Absolute: 3 10*3/uL (ref 1.4–7.0)
Neutrophils: 57 %
Platelets: 237 10*3/uL (ref 150–450)
RBC: 4.35 x10E6/uL (ref 3.77–5.28)
RDW: 13.6 % (ref 11.7–15.4)
WBC: 5.2 10*3/uL (ref 3.4–10.8)

## 2022-06-22 LAB — TSH+FREE T4
Free T4: 0.96 ng/dL (ref 0.82–1.77)
TSH: 3.12 u[IU]/mL (ref 0.450–4.500)

## 2022-06-22 LAB — LIPID PANEL
Chol/HDL Ratio: 2.4 ratio (ref 0.0–4.4)
Cholesterol, Total: 245 mg/dL — ABNORMAL HIGH (ref 100–199)
HDL: 102 mg/dL (ref >39)
LDL Chol Calc (NIH): 128 mg/dL — ABNORMAL HIGH (ref 0–99)
Triglycerides: 92 mg/dL (ref 0–149)
VLDL Cholesterol Cal: 15 mg/dL (ref 5–40)

## 2022-06-22 LAB — VITAMIN B12: Vitamin B-12: 309 pg/mL (ref 232–1245)

## 2022-06-25 ENCOUNTER — Encounter: Payer: Self-pay | Admitting: Physician Assistant

## 2022-06-25 DIAGNOSIS — N959 Unspecified menopausal and perimenopausal disorder: Secondary | ICD-10-CM

## 2022-06-26 ENCOUNTER — Other Ambulatory Visit: Payer: Self-pay | Admitting: Family Medicine

## 2022-06-26 ENCOUNTER — Other Ambulatory Visit: Payer: Self-pay

## 2022-06-26 DIAGNOSIS — E039 Hypothyroidism, unspecified: Secondary | ICD-10-CM

## 2022-06-26 DIAGNOSIS — Z6827 Body mass index (BMI) 27.0-27.9, adult: Secondary | ICD-10-CM

## 2022-06-26 MED ORDER — LEVOTHYROXINE SODIUM 25 MCG PO TABS: 25.0000 ug | ORAL_TABLET | Freq: Every day | ORAL | 1 refills | Status: DC

## 2022-06-26 MED ORDER — VENLAFAXINE HCL ER 37.5 MG PO CP24: 37.5000 mg | ORAL_CAPSULE | Freq: Every day | ORAL | 1 refills | Status: DC

## 2022-06-26 MED ORDER — PHENTERMINE HCL 37.5 MG PO CAPS: 37.5000 mg | ORAL_CAPSULE | ORAL | 0 refills | Status: DC

## 2022-06-27 ENCOUNTER — Other Ambulatory Visit: Payer: Self-pay | Admitting: Family Medicine

## 2022-06-27 DIAGNOSIS — Z6827 Body mass index (BMI) 27.0-27.9, adult: Secondary | ICD-10-CM

## 2022-07-02 ENCOUNTER — Encounter: Payer: Self-pay | Admitting: Physician Assistant

## 2022-07-30 ENCOUNTER — Ambulatory Visit: Payer: BC Managed Care – PPO

## 2022-07-31 ENCOUNTER — Ambulatory Visit (INDEPENDENT_AMBULATORY_CARE_PROVIDER_SITE_OTHER): Payer: BC Managed Care – PPO

## 2022-07-31 DIAGNOSIS — Z23 Encounter for immunization: Secondary | ICD-10-CM

## 2022-12-20 ENCOUNTER — Ambulatory Visit: Payer: BC Managed Care – PPO | Admitting: Family Medicine

## 2022-12-20 ENCOUNTER — Ambulatory Visit (INDEPENDENT_AMBULATORY_CARE_PROVIDER_SITE_OTHER): Payer: BC Managed Care – PPO | Admitting: Physician Assistant

## 2022-12-20 ENCOUNTER — Encounter: Payer: Self-pay | Admitting: Physician Assistant

## 2022-12-20 ENCOUNTER — Telehealth: Payer: Self-pay | Admitting: Physician Assistant

## 2022-12-20 VITALS — BP 128/87 | Temp 97.7°F | Ht 64.5 in | Wt 166.0 lb

## 2022-12-20 DIAGNOSIS — E039 Hypothyroidism, unspecified: Secondary | ICD-10-CM | POA: Diagnosis not present

## 2022-12-20 DIAGNOSIS — Z23 Encounter for immunization: Secondary | ICD-10-CM

## 2022-12-20 DIAGNOSIS — E663 Overweight: Secondary | ICD-10-CM | POA: Diagnosis not present

## 2022-12-20 DIAGNOSIS — I10 Essential (primary) hypertension: Secondary | ICD-10-CM | POA: Diagnosis not present

## 2022-12-20 DIAGNOSIS — N959 Unspecified menopausal and perimenopausal disorder: Secondary | ICD-10-CM

## 2022-12-20 DIAGNOSIS — Z6828 Body mass index (BMI) 28.0-28.9, adult: Secondary | ICD-10-CM

## 2022-12-20 DIAGNOSIS — Z1231 Encounter for screening mammogram for malignant neoplasm of breast: Secondary | ICD-10-CM

## 2022-12-20 MED ORDER — SEMAGLUTIDE-WEIGHT MANAGEMENT 0.25 MG/0.5ML ~~LOC~~ SOAJ: 0.2500 mg | SUBCUTANEOUS | 0 refills | Status: AC

## 2022-12-20 MED ORDER — ESTRADIOL 0.1 MG/GM VA CREA: 1.0000 | TOPICAL_CREAM | VAGINAL | 12 refills | Status: AC

## 2022-12-20 MED ORDER — LEVOTHYROXINE SODIUM 25 MCG PO TABS: 25.0000 ug | ORAL_TABLET | Freq: Every day | ORAL | 3 refills | Status: DC

## 2022-12-20 MED ORDER — VENLAFAXINE HCL ER 37.5 MG PO CP24: 37.5000 mg | ORAL_CAPSULE | Freq: Every day | ORAL | 3 refills | Status: AC

## 2022-12-20 NOTE — Telephone Encounter (Signed)
Received a fax from covermymeds stating a pa worked on by our office requires additional action  Key:  [id number]

## 2022-12-20 NOTE — Assessment & Plan Note (Signed)
Stable with effexor 35.7 mg

## 2022-12-20 NOTE — Progress Notes (Signed)
Established patient visit   Patient: Erika May   DOB: [birth year]   57 y.o. Female  MRN: [id number] Visit Date: 12/20/2022  Today's healthcare provider: Mikey Kirschner, PA-C   Cc. Htn f/u  Subjective     Hypertension, follow-up  BP Readings from Last 3 Encounters:  12/20/22 128/87  06/19/22 121/83  03/06/22 120/63   Wt Readings from Last 3 Encounters:  12/20/22 166 lb (75.3 kg)  06/19/22 161 lb 3.2 oz (73.1 kg)  06/18/21 157 lb 3.2 oz (71.3 kg)     She was last seen for hypertension 06/19/22, well controlled.  She reports excellent compliance with treatment.  She does not smoke.  Denies chest pain, SOB.  Pertinent labs Lab Results  Component Value Date   CHOL 245 (H) 06/21/2022   HDL 102 06/21/2022   LDLCALC 128 (H) 06/21/2022   TRIG 92 06/21/2022   CHOLHDL 2.4 06/21/2022   Lab Results  Component Value Date   NA 137 06/21/2022   K 4.0 06/21/2022   CREATININE 1.07 (H) 06/21/2022   EGFR 61 06/21/2022   GLUCOSE 92 06/21/2022   TSH 3.120 06/21/2022     The ASCVD Risk score (Arnett DK, et al., 2019) failed to calculate for the following reasons:   The valid HDL cholesterol range is 20 to 100 mg/dL  ---------------------------------------------------------------------------------------------------   Overweight Pt reports a difficult time losing weight. She did a trial of phentermine 6 months ago, felt it only started worked when she was almost done with it.   Medications: Outpatient Medications Prior to Visit  Medication Sig   acetaminophen (TYLENOL) 650 MG CR tablet Take 650 mg by mouth every 8 (eight) hours as needed for pain.   Calcium Carb-Cholecalciferol (CALCIUM + D3) 600-200 MG-UNIT TABS Take by mouth 3 (three) times a week.    calcium carbonate (TUMS - DOSED IN MG ELEMENTAL CALCIUM) 500 MG chewable tablet Chew 1 tablet by mouth as needed for indigestion or heartburn.   diclofenac Sodium (VOLTAREN) 1 % GEL Apply topically 4 (four)  times daily.   ibuprofen (ADVIL,MOTRIN) 200 MG tablet Take 200 mg by mouth every 6 (six) hours as needed for headache.    Multiple Vitamin (MULTIVITAMIN) capsule Take 1 capsule by mouth daily. am   Sennosides-Docusate Sodium (PERI-COLACE PO) Take by mouth as needed.   triamterene-hydrochlorothiazide (MAXZIDE-25) 37.5-25 MG tablet Take 1 tablet by mouth daily.   [DISCONTINUED] estradiol (ESTRACE VAGINAL) 0.1 MG/GM vaginal cream Place 1 Applicatorful vaginally 3 (three) times a week.   [DISCONTINUED] levothyroxine (SYNTHROID) 25 MCG tablet Take 1 tablet (25 mcg total) by mouth daily before breakfast.   [DISCONTINUED] venlafaxine XR (EFFEXOR XR) 37.5 MG 24 hr capsule Take 1 capsule (37.5 mg total) by mouth daily with breakfast.   [DISCONTINUED] albuterol (VENTOLIN HFA) 108 (90 Base) MCG/ACT inhaler Inhale 2 puffs into the lungs every 6 (six) hours as needed for wheezing or shortness of breath. (Patient not taking: Reported on 06/19/2022)   [DISCONTINUED] phentermine 37.5 MG capsule Take 1 capsule (37.5 mg total) by mouth every morning.   No facility-administered medications prior to visit.    Review of Systems  Constitutional:  Negative for fatigue and fever.  Respiratory:  Negative for cough and shortness of breath.   Cardiovascular:  Negative for chest pain and leg swelling.  Gastrointestinal:  Negative for abdominal pain.  Neurological:  Negative for dizziness and headaches.       Objective    BP 128/87 (BP Location: Right Arm,  Patient Position: Sitting, Cuff Size: Normal)   Temp 97.7 F (36.5 C)   Ht 5' 4.5" (1.638 m)   Wt 166 lb (75.3 kg)   LMP 07/12/2019   SpO2 94%   BMI 28.05 kg/m  Blood pressure 128/87, temperature 97.7 F (36.5 C), height 5' 4.5" (1.638 m), weight 166 lb (75.3 kg), last menstrual period 07/12/2019, SpO2 94 %.   Physical Exam Constitutional:      General: She is awake.     Appearance: She is well-developed.  HENT:     Head: Normocephalic.  Eyes:      Conjunctiva/sclera: Conjunctivae normal.  Cardiovascular:     Rate and Rhythm: Normal rate and regular rhythm.     Heart sounds: Normal heart sounds.  Pulmonary:     Effort: Pulmonary effort is normal.     Breath sounds: Normal breath sounds.  Skin:    General: Skin is warm.  Neurological:     Mental Status: She is alert and oriented to person, place, and time.  Psychiatric:        Attention and Perception: Attention normal.        Mood and Affect: Mood normal.        Speech: Speech normal.        Behavior: Behavior is cooperative.      No results found for any visits on 12/20/22.  Assessment & Plan     Problem List Items Addressed This Visit       Cardiovascular and Mediastinum   Essential hypertension - Primary    Chronic and well controlled Continue with maxide  Reviewed last cmp -- pt aware she needs to hydrate F/u 6 mo         Endocrine   Adult hypothyroidism    Refilled levothyroxine reviewed last thyroid labs      Relevant Medications   levothyroxine (SYNTHROID) 25 MCG tablet     Other   Menopausal disorder    Stable with effexor 35.7 mg        Relevant Medications   estradiol (ESTRACE VAGINAL) 0.1 MG/GM vaginal cream   venlafaxine XR (EFFEXOR XR) 37.5 MG 24 hr capsule   Overweight (BMI 25.0-29.9)    Discussed phentermine vs glp-1  Given her htn, hld, and family history of diabetes, she would be a good candidate for a glp Discussed wegovy, MOA, method of admin. Would ideally take ~ 1 year Would start on 0.25 mg weekly x 4 weeks and then increase to 0. 5 mg weekly and stay at that dose Rx sent and started PA       Relevant Medications   Semaglutide-Weight Management 0.25 MG/0.5ML SOAJ   Other Visit Diagnoses     BMI 28.0-28.9,adult       Relevant Medications   Semaglutide-Weight Management 0.25 MG/0.5ML SOAJ   Breast cancer screening by mammogram       Relevant Orders   MM 3D SCREEN BREAST BILATERAL   Need for shingles vaccine        Relevant Orders   Zoster Recombinant (Shingrix ) (Completed)       Return in about 6 months (around 06/20/2023) for CPE or earlier for weight management if wegovy is approved.      I, Mikey Kirschner, PA-C have reviewed all documentation for this visit. The documentation on  12/20/22 for the exam, diagnosis, procedures, and orders are all accurate and complete.  Mikey Kirschner, PA-C [facility] [address] [place], [place], [address] Office: [phone] Fax:  [place]

## 2022-12-20 NOTE — Assessment & Plan Note (Signed)
Chronic and well controlled Continue with maxide  Reviewed last cmp -- pt aware she needs to hydrate F/u 6 mo

## 2022-12-20 NOTE — Assessment & Plan Note (Signed)
Refilled levothyroxine reviewed last thyroid labs

## 2022-12-20 NOTE — Assessment & Plan Note (Signed)
Discussed phentermine vs glp-1  Given her htn, hld, and family history of diabetes, she would be a good candidate for a glp Discussed wegovy, MOA, method of admin. Would ideally take ~ 1 year Would start on 0.25 mg weekly x 4 weeks and then increase to 0. 5 mg weekly and stay at that dose Rx sent and started PA

## 2022-12-23 ENCOUNTER — Encounter: Payer: Self-pay | Admitting: Physician Assistant

## 2022-12-24 ENCOUNTER — Telehealth: Payer: Self-pay | Admitting: Physician Assistant

## 2022-12-24 NOTE — Telephone Encounter (Signed)
Patient prescribed wegovy not ozempic Please see additional encounter from 12/20/22

## 2022-12-24 NOTE — Telephone Encounter (Signed)
Covermymeds prior authorization needed Key: [id number] Name: Erika May Prescription: not listed just states that it is on a prior auth and requires additional action

## 2022-12-30 ENCOUNTER — Telehealth: Payer: Self-pay | Admitting: Physician Assistant

## 2023-01-03 ENCOUNTER — Encounter: Payer: Self-pay | Admitting: Physician Assistant

## 2023-01-03 ENCOUNTER — Other Ambulatory Visit: Payer: Self-pay | Admitting: Physician Assistant

## 2023-01-03 DIAGNOSIS — Z6828 Body mass index (BMI) 28.0-28.9, adult: Secondary | ICD-10-CM

## 2023-01-03 DIAGNOSIS — E663 Overweight: Secondary | ICD-10-CM

## 2023-01-03 MED ORDER — PHENTERMINE HCL 30 MG PO CAPS: 30.0000 mg | ORAL_CAPSULE | ORAL | 0 refills | Status: DC

## 2023-01-03 NOTE — Telephone Encounter (Signed)
duplicate

## 2023-01-09 ENCOUNTER — Ambulatory Visit
Admission: RE | Admit: 2023-01-09 | Discharge: 2023-01-09 | Disposition: A | Discharge status: Home / Self Care | Payer: BC Managed Care – PPO | Source: Ambulatory Visit | Attending: Physician Assistant | Admitting: Physician Assistant

## 2023-01-09 DIAGNOSIS — Z1231 Encounter for screening mammogram for malignant neoplasm of breast: Secondary | ICD-10-CM | POA: Insufficient documentation

## 2023-03-15 ENCOUNTER — Other Ambulatory Visit: Payer: Self-pay | Admitting: Physician Assistant

## 2023-03-15 DIAGNOSIS — E663 Overweight: Secondary | ICD-10-CM

## 2023-03-15 DIAGNOSIS — Z6828 Body mass index (BMI) 28.0-28.9, adult: Secondary | ICD-10-CM

## 2023-03-26 ENCOUNTER — Telehealth: Payer: Self-pay | Admitting: Physician Assistant

## 2023-03-26 NOTE — Telephone Encounter (Signed)
Covermymeds requesting prior authorization Key: [id number] Name: Jewell-hinkle Ozempic 0.25 or 0.5mg /dose 2mg /17ml pen injectors

## 2023-03-26 NOTE — Telephone Encounter (Signed)
Your information has been submitted to [employer]. To check for an updated outcome later, reopen this PA request from your dashboard. If [employer] has not responded to your request within 24 hours, contact [employer] at [phone]. If you think there may be a problem with your PA request, use our live chat feature at the bottom right.    

## 2023-06-29 ENCOUNTER — Other Ambulatory Visit: Payer: Self-pay | Admitting: Physician Assistant

## 2023-06-29 DIAGNOSIS — I10 Essential (primary) hypertension: Secondary | ICD-10-CM

## 2023-09-09 IMAGING — MG DIGITAL SCREENING BREAST BILAT IMPLANT W/ TOMO W/ CAD
9 of 12 series · 9 of 28 positions shown · non-contrast
Comparison: Previous exam(s).

CLINICAL DATA: Screening.

EXAM:
DIGITAL SCREENING BILATERAL MAMMOGRAM WITH IMPLANTS, CAD AND
TOMOSYNTHESIS
TECHNIQUE: Bilateral screening digital craniocaudal and mediolateral oblique
mammograms were obtained. Bilateral screening digital breast
tomosynthesis was performed. The images were evaluated with
computer-aided detection. Standard and/or implant displaced views
were performed.

[R MLO]
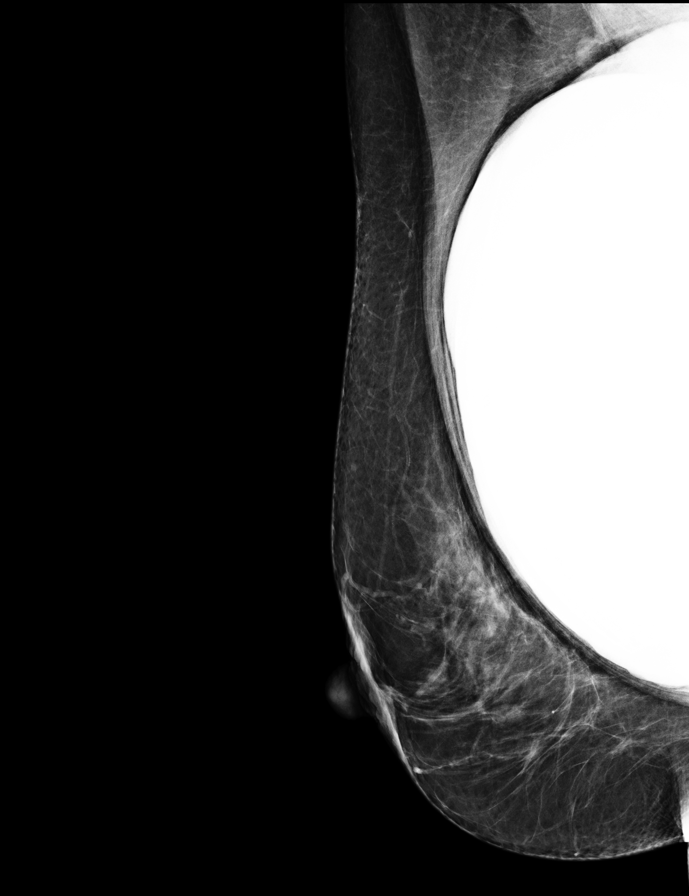

[R CC]
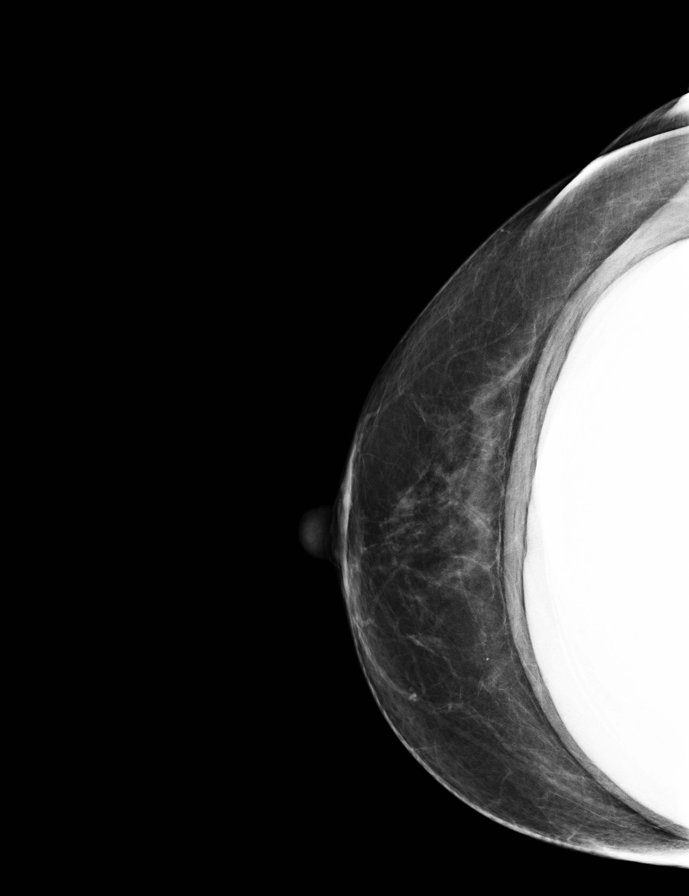

[L MLO]
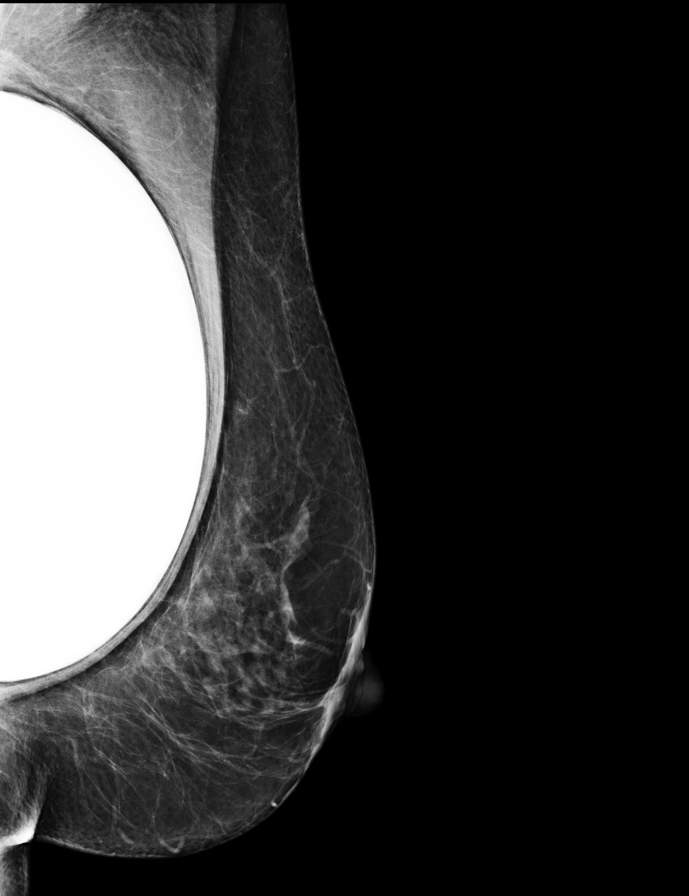

[L CC]
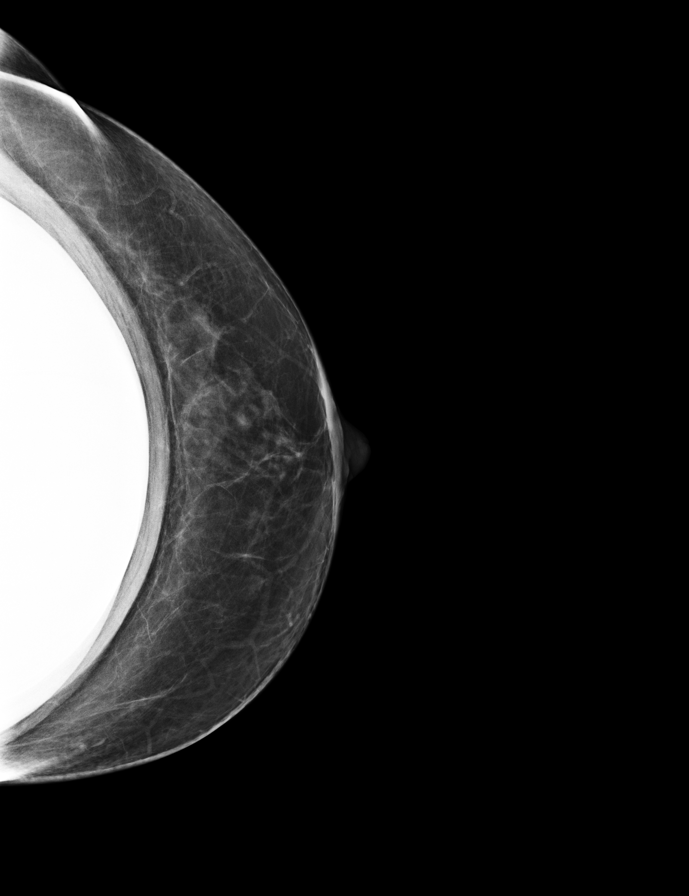

[L MLO synth-2D]
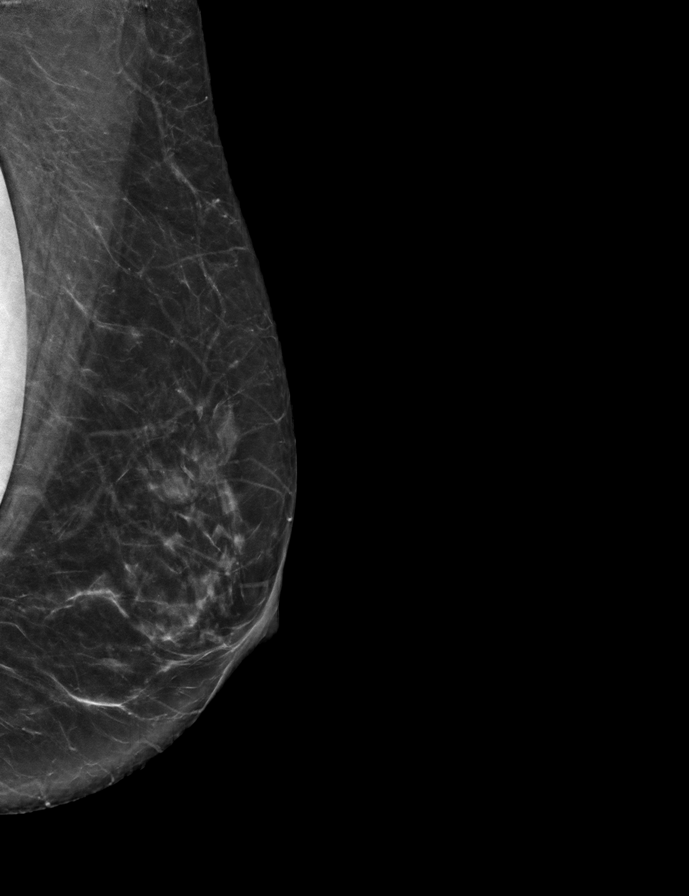

[R MLO synth-2D]
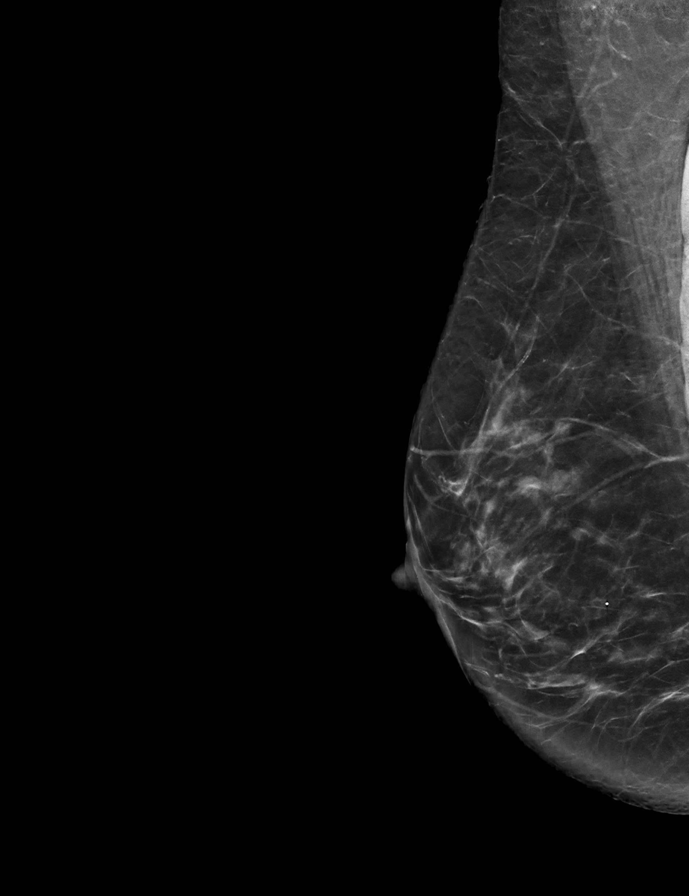

[L CC synth-2D]
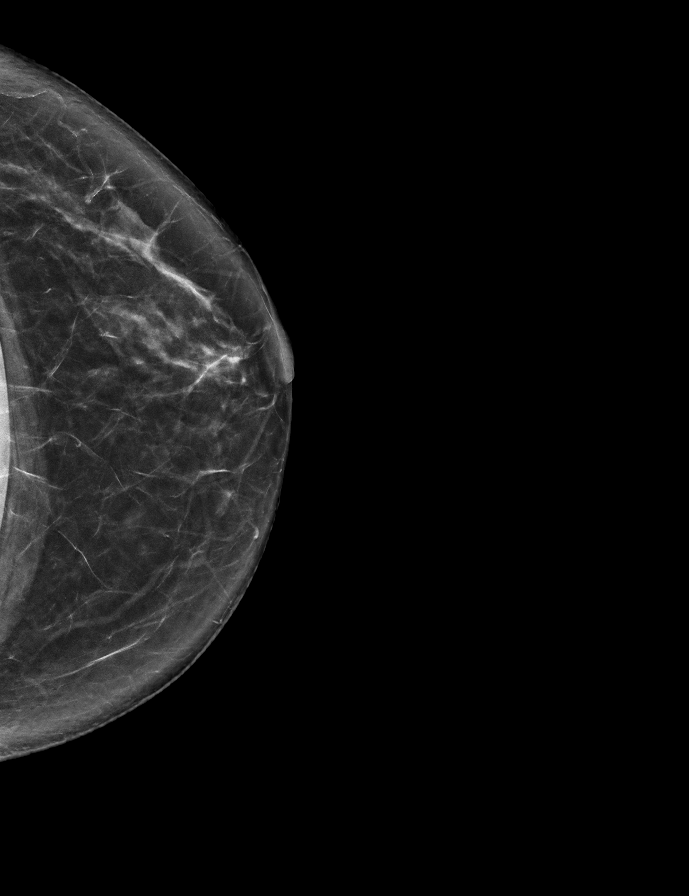

[R CC synth-2D]
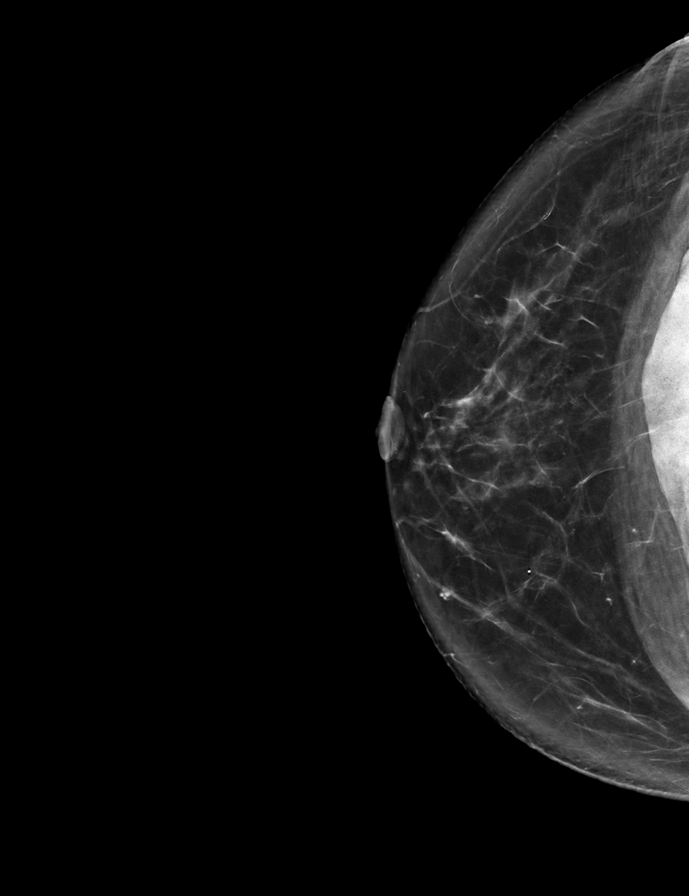

[R CCID BREAST TOMOSYNTHESIS IMAGE tomo · tomo slice 33/66.0]
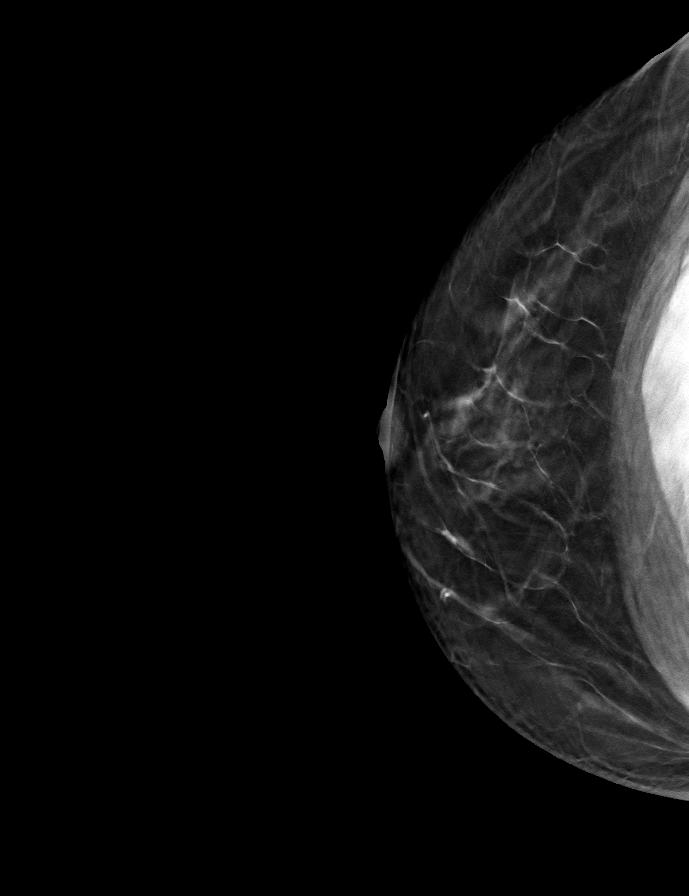

[9 of 28 positions shown; findings below may reference images not displayed]

ACR Breast Density Category b: There are scattered areas of
fibroglandular density.
FINDINGS: The patient has retropectoral implants. There are no findings
suspicious for malignancy.
IMPRESSION: No mammographic evidence of malignancy. A result letter of this
screening mammogram will be mailed directly to the patient.

RECOMMENDATION:
Screening mammogram in one year. (Code:SE-S-JMG)

BI-RADS CATEGORY  1:  Negative.

## 2023-12-09 ENCOUNTER — Other Ambulatory Visit: Payer: Self-pay | Admitting: Physician Assistant

## 2023-12-09 DIAGNOSIS — E039 Hypothyroidism, unspecified: Secondary | ICD-10-CM

## 2024-03-29 ENCOUNTER — Telehealth: Payer: Self-pay | Admitting: Physician Assistant

## 2024-03-29 NOTE — Telephone Encounter (Signed)
 [facility] pharmacy is requesting refill levothyroxine  (SYNTHROID ) 25 MCG tablet  Please advise

## 2024-03-29 NOTE — Telephone Encounter (Signed)
 Spoke with patient, states she changed practices and goes to [facility] doctor now. Advised patient I would refused medication since she has not seen a provider at [facility] in a while. Patient understood why it was taking so long to get her medication filled as pharmacy is requesting refill at the wrong family office.
